# Patient Record
Sex: Female | Born: 1945
Health system: Southern US, Community
[De-identification: ages and names within clinical notes are randomized; demographics above are authoritative.]

## PROBLEM LIST (undated history)

## (undated) DIAGNOSIS — E785 Hyperlipidemia, unspecified: Secondary | ICD-10-CM

## (undated) DIAGNOSIS — K5790 Diverticulosis of intestine, part unspecified, without perforation or abscess without bleeding: Secondary | ICD-10-CM

## (undated) DIAGNOSIS — I10 Essential (primary) hypertension: Secondary | ICD-10-CM

## (undated) DIAGNOSIS — C801 Malignant (primary) neoplasm, unspecified: Secondary | ICD-10-CM

## (undated) DIAGNOSIS — D649 Anemia, unspecified: Secondary | ICD-10-CM

## (undated) DIAGNOSIS — M1990 Unspecified osteoarthritis, unspecified site: Secondary | ICD-10-CM

## (undated) DIAGNOSIS — N952 Postmenopausal atrophic vaginitis: Secondary | ICD-10-CM

## (undated) DIAGNOSIS — M316 Other giant cell arteritis: Secondary | ICD-10-CM

## (undated) HISTORY — DX: Hyperlipidemia, unspecified: E78.5

## (undated) HISTORY — DX: Essential (primary) hypertension: I10

## (undated) HISTORY — PX: COLON SURGERY: SHX602

## (undated) HISTORY — DX: Unspecified osteoarthritis, unspecified site: M19.90

## (undated) HISTORY — DX: Other giant cell arteritis: M31.6

## (undated) HISTORY — PX: OTHER SURGICAL HISTORY: SHX169

## (undated) HISTORY — DX: Diverticulosis of intestine, part unspecified, without perforation or abscess without bleeding: K57.90

## (undated) HISTORY — PX: TONSILLECTOMY: SHX5217

## (undated) HISTORY — DX: Postmenopausal atrophic vaginitis: N95.2

## (undated) HISTORY — DX: Anemia, unspecified: D64.9

## (undated) HISTORY — DX: Malignant (primary) neoplasm, unspecified: C80.1

---

## 1974-05-30 HISTORY — PX: TUBAL LIGATION: SHX77

## 1996-05-30 HISTORY — PX: OTHER SURGICAL HISTORY: SHX169

## 1997-09-16 ENCOUNTER — Other Ambulatory Visit: Admission: RE | Admit: 1997-09-16 | Discharge: 1997-09-16 | Payer: Self-pay | Admitting: Obstetrics and Gynecology

## 1998-12-03 ENCOUNTER — Other Ambulatory Visit: Admission: RE | Admit: 1998-12-03 | Discharge: 1998-12-03 | Payer: Self-pay | Admitting: Obstetrics and Gynecology

## 2000-02-18 ENCOUNTER — Other Ambulatory Visit: Admission: RE | Admit: 2000-02-18 | Discharge: 2000-02-18 | Payer: Self-pay | Admitting: Obstetrics and Gynecology

## 2000-11-14 ENCOUNTER — Other Ambulatory Visit: Admission: RE | Admit: 2000-11-14 | Discharge: 2000-11-14 | Payer: Self-pay | Admitting: Obstetrics and Gynecology

## 2000-11-14 ENCOUNTER — Encounter (INDEPENDENT_AMBULATORY_CARE_PROVIDER_SITE_OTHER): Payer: Self-pay | Admitting: Specialist

## 2004-02-06 ENCOUNTER — Ambulatory Visit (HOSPITAL_COMMUNITY): Admission: RE | Admit: 2004-02-06 | Discharge: 2004-02-06 | Payer: Self-pay | Admitting: Internal Medicine

## 2005-04-29 HISTORY — PX: OTHER SURGICAL HISTORY: SHX169

## 2005-06-09 ENCOUNTER — Ambulatory Visit: Payer: Self-pay | Admitting: Internal Medicine

## 2005-06-13 ENCOUNTER — Other Ambulatory Visit: Admission: RE | Admit: 2005-06-13 | Discharge: 2005-06-13 | Payer: Self-pay | Admitting: Internal Medicine

## 2005-06-13 ENCOUNTER — Encounter: Payer: Self-pay | Admitting: Internal Medicine

## 2005-06-13 ENCOUNTER — Ambulatory Visit: Payer: Self-pay | Admitting: Internal Medicine

## 2007-08-24 DIAGNOSIS — I10 Essential (primary) hypertension: Secondary | ICD-10-CM

## 2007-08-24 DIAGNOSIS — E785 Hyperlipidemia, unspecified: Secondary | ICD-10-CM

## 2007-08-24 HISTORY — DX: Essential (primary) hypertension: I10

## 2007-08-24 HISTORY — DX: Hyperlipidemia, unspecified: E78.5

## 2008-12-25 ENCOUNTER — Encounter: Payer: Self-pay | Admitting: Internal Medicine

## 2009-04-07 ENCOUNTER — Encounter: Payer: Self-pay | Admitting: Internal Medicine

## 2009-04-07 ENCOUNTER — Other Ambulatory Visit: Admission: RE | Admit: 2009-04-07 | Discharge: 2009-04-07 | Payer: Self-pay | Admitting: Internal Medicine

## 2009-04-07 ENCOUNTER — Ambulatory Visit: Payer: Self-pay | Admitting: Internal Medicine

## 2009-04-07 LAB — CONVERTED CEMR LAB
ALT: 15 units/L (ref 0–35)
Albumin: 4.3 g/dL (ref 3.5–5.2)
Basophils Absolute: 0.1 10*3/uL (ref 0.0–0.1)
Basophils Relative: 1 % (ref 0.0–3.0)
Bilirubin Urine: NEGATIVE
Bilirubin, Direct: 0.1 mg/dL (ref 0.0–0.3)
CO2: 29 meq/L (ref 19–32)
Calcium: 9.8 mg/dL (ref 8.4–10.5)
Creatinine, Ser: 0.7 mg/dL (ref 0.4–1.2)
Eosinophils Relative: 2.2 % (ref 0.0–5.0)
HCT: 36.1 % (ref 36.0–46.0)
Hemoglobin, Urine: NEGATIVE
Ketones, ur: NEGATIVE mg/dL
Lymphocytes Relative: 32.9 % (ref 12.0–46.0)
MCHC: 34.8 g/dL (ref 30.0–36.0)
MCV: 93.9 fL (ref 78.0–100.0)
Monocytes Absolute: 0.4 10*3/uL (ref 0.1–1.0)
Monocytes Relative: 7.4 % (ref 3.0–12.0)
Neutro Abs: 3.2 10*3/uL (ref 1.4–7.7)
Neutrophils Relative %: 56.5 % (ref 43.0–77.0)
Platelets: 330 10*3/uL (ref 150.0–400.0)
RBC: 3.84 M/uL — ABNORMAL LOW (ref 3.87–5.11)
RDW: 11.7 % (ref 11.5–14.6)
Total Protein, Urine: NEGATIVE mg/dL
Triglycerides: 101 mg/dL (ref 0.0–149.0)
WBC: 5.7 10*3/uL (ref 4.5–10.5)

## 2009-04-08 ENCOUNTER — Telehealth: Payer: Self-pay | Admitting: Internal Medicine

## 2009-04-10 ENCOUNTER — Encounter: Payer: Self-pay | Admitting: Internal Medicine

## 2009-07-28 ENCOUNTER — Encounter: Payer: Self-pay | Admitting: Internal Medicine

## 2010-07-01 NOTE — Letter (Signed)
Summary: Referral - not able to see patient  [facility]  [address] [place], [place] [address]   Phone: [phone]  Fax: [phone]    July 28, 2009   Illene Regulus, M.D. [address] [place], [place] [address]   Re:   Alexis Price DOB:  1946/01/09 MRN:   [id number]    Dear Dr. Debby Bud:  Thank you for your kind referral of the above patient.  We have attempted to schedule the recommended procedure Screening Colonoscopy but have not been able to schedule because:   X  The patient was not available by phone and/or has not returned our calls.  ___ The patient declined to schedule the procedure at this time.  We appreciate the referral and hope that we will have the opportunity to treat this patient in the future.    Sincerely,    [facility] [phone]

## 2010-10-16 ENCOUNTER — Other Ambulatory Visit: Payer: Self-pay | Admitting: Internal Medicine

## 2011-04-08 ENCOUNTER — Other Ambulatory Visit: Payer: Self-pay | Admitting: Internal Medicine

## 2011-05-11 ENCOUNTER — Ambulatory Visit (INDEPENDENT_AMBULATORY_CARE_PROVIDER_SITE_OTHER): Payer: Medicare Other | Admitting: Internal Medicine

## 2011-05-11 ENCOUNTER — Other Ambulatory Visit: Payer: Self-pay | Admitting: Internal Medicine

## 2011-05-11 ENCOUNTER — Other Ambulatory Visit (INDEPENDENT_AMBULATORY_CARE_PROVIDER_SITE_OTHER): Payer: Medicare Other

## 2011-05-11 VITALS — BP 154/80 | HR 66 | Temp 98.0°F | Ht 62.5 in | Wt 149.0 lb

## 2011-05-11 DIAGNOSIS — E785 Hyperlipidemia, unspecified: Secondary | ICD-10-CM

## 2011-05-11 DIAGNOSIS — Z0000 Encounter for general adult medical examination without abnormal findings: Secondary | ICD-10-CM

## 2011-05-11 DIAGNOSIS — I10 Essential (primary) hypertension: Secondary | ICD-10-CM

## 2011-05-11 DIAGNOSIS — N952 Postmenopausal atrophic vaginitis: Secondary | ICD-10-CM

## 2011-05-11 DIAGNOSIS — Z23 Encounter for immunization: Secondary | ICD-10-CM

## 2011-05-11 DIAGNOSIS — Z136 Encounter for screening for cardiovascular disorders: Secondary | ICD-10-CM

## 2011-05-11 LAB — HEPATIC FUNCTION PANEL
ALT: 16 U/L (ref 0–35)
AST: 22 U/L (ref 0–37)
Albumin: 4.2 g/dL (ref 3.5–5.2)
Alkaline Phosphatase: 53 U/L (ref 39–117)
Bilirubin, Direct: 0 mg/dL (ref 0.0–0.3)
Total Bilirubin: 0.5 mg/dL (ref 0.3–1.2)
Total Protein: 7.3 g/dL (ref 6.0–8.3)

## 2011-05-11 LAB — COMPREHENSIVE METABOLIC PANEL
ALT: 16 U/L (ref 0–35)
AST: 22 U/L (ref 0–37)
Albumin: 4.2 g/dL (ref 3.5–5.2)
Alkaline Phosphatase: 53 U/L (ref 39–117)
CO2: 30 mEq/L (ref 19–32)
Calcium: 9.7 mg/dL (ref 8.4–10.5)
Chloride: 103 mEq/L (ref 96–112)
Glucose, Bld: 88 mg/dL (ref 70–99)
Potassium: 4 mEq/L (ref 3.5–5.1)
Sodium: 139 mEq/L (ref 135–145)
Total Bilirubin: 0.5 mg/dL (ref 0.3–1.2)

## 2011-05-11 LAB — LIPID PANEL
Total CHOL/HDL Ratio: 3
Triglycerides: 90 mg/dL (ref 0.0–149.0)
VLDL: 18 mg/dL (ref 0.0–40.0)

## 2011-05-11 LAB — TSH: TSH: 1.87 u[IU]/mL (ref 0.35–5.50)

## 2011-05-11 MED ORDER — SULFAMETHOXAZOLE-TRIMETHOPRIM 800-160 MG PO TABS: 1.0000 | ORAL_TABLET | Freq: Two times a day (BID) | ORAL | Status: AC

## 2011-05-11 MED ORDER — SIMVASTATIN 20 MG PO TABS: 40.0000 mg | ORAL_TABLET | Freq: Every day | ORAL | Status: DC

## 2011-05-11 MED ORDER — LISINOPRIL-HYDROCHLOROTHIAZIDE 20-12.5 MG PO TABS: 1.0000 | ORAL_TABLET | Freq: Every day | ORAL | Status: DC

## 2011-05-11 NOTE — Progress Notes (Signed)
Subjective:    Patient ID: Alexis Price, female    DOB: 07-15-1945, 65 y.o.   MRN: [id number]  HPI   The patient is here for annual Medicare wellness examination and management of other chronic and acute problems.  In the interval she has had no major medical illness, no injury and no surgery. She has developed paresthesia of the right thumb; she has had a cold with a bitter, metallic taste in the mouth. She does report dysparunia - chart review indicates she had atrophic introitus and atrophic vaginitis at her exam in '10   The risk factors are reflected in the social history.  The roster of all physicians providing medical care to patient - is listed in the Snapshot section of the chart.  Activities of daily living:  The patient is 100% inedpendent in all ADLs: dressing, toileting, feeding as well as independent mobility  Home safety : The patient has smoke detectors in the home. They wear seatbelts. No firearms at home. There is no violence in the home.   There is no risks for hepatitis, STDs or HIV. There is no   history of blood transfusion. They have no travel history to infectious disease endemic areas of the world.  The patient has not seen their dentist in the last six month. They have seen their eye doctor in the last year. They deny  any hearing difficulty and have not had audiologic testing in the last year.  They do not  have excessive sun exposure. Discussed the need for sun protection: hats, long sleeves and use of sunscreen if there is significant sun exposure.   Diet: the importance of a healthy diet is discussed. They do have a healthy diet.  The patient has a regular exercise program: cardio , 40 minutes duration, 3 to 4 times per week.  The benefits of regular aerobic exercise were discussed.  Depression screen: there are no signs or vegative symptoms of depression- irritability, change in appetite, anhedonia, sadness/tearfullness.  Cognitive assessment: the  patient manages all their financial and personal affairs and is actively engaged.   The following portions of the patient's history were reviewed and updated as appropriate: allergies, current medications, past family history, past medical history,  past surgical history, past social history  and problem list.  Vision, hearing, body mass index were assessed and reviewed.   During the course of the visit the patient was educated and counseled about appropriate screening and preventive services including : fall prevention , diabetes screening, nutrition counseling, colorectal cancer screening, and recommended immunizations.   Past History:  Past Medical History: Last updated: 08/24/2007 Hypertension Hyperlipidemia  Past Surgical History: Last updated: 08/24/2007 Tonsillectomy - age 63 Tubal ligation -1976 Emergent colectomy with creation of colosomy Takedown of colostomy with salpingectomy and lysis of adhesion- 04/2005 Flex sigmoidoscopy- 1998  Family History: Father- 75 years, died of MI Mother- mother is 25, HTN,  valve replacements, CAD  Social History: Married '67 2 sons - '69, '76 Retired from 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  Yes,  feels safe in home Minimal exercise 3-4 times per week-achohol use No smoking, no recreational drug use Sexually active, one partner   Review of Systems Constitutional:  Negative for fever, chills, activity change and unexpected weight change.  HEENT:  Negative for hearing loss, ear pain, congestion, neck stiffness and postnasal drip. Negative for sore throat or swallowing problems. Negative for dental complaints.   Eyes: Negative for vision loss or change in  visual acuity.  Respiratory: Negative for chest tightness and wheezing. Negative for DOE.   Cardiovascular: Negative for chest pain or palpitations. No decreased exercise tolerance Gastrointestinal: No change in bowel habit. No bloating or gas. No reflux or  indigestion Genitourinary: Negative for urgency, frequency, flank pain and difficulty urinating. Dysparunia. Stress incontinence Musculoskeletal: Negative for myalgias, back pain, arthralgias and gait problem. Chronic hand arthritis Neurological: Negative for dizziness, tremors, weakness and headaches.  Hematological: Negative for adenopathy.  Psychiatric/Behavioral: Negative for behavioral problems and dysphoric mood.         Objective:   Physical Exam Vitals - mild increase in BP noted 154/80 Gen'l- WNWD white woman in no distress HEENT - C&S clear, pupils equal and round, fundi - normal w/ handheld instrument; EACs/TMs normal; oropharynx w/o lesions, dentition in good repair. Neck - supple, no thyromegaly Nodes - negative submandibular, cervical and axillary areas Pulm - normal respirations Breast exam - skin normal, nipples w/o discharge, no fixed mass or lesions noted. Cor - 2+ radial and DP pulses, RRR w/o murmurs Abd - BS+, soft, no hepato-splenomegaly, no guarding or rebound Pelvic/rectal - deferred to normal exam, except for atrophic vaginitis, 2010 Ext - no deformity, normal Range of motion all medium and large joints Neuro - CN II-XII intact, normal strength, normal DTRs, normal gail Skin - no suspicious lesions face, neck, upper back, chest.  Labs ordered and pending        Assessment & Plan:

## 2011-05-11 NOTE — Progress Notes (Deleted)
  Subjective:    Patient ID: Alexis Price, female    DOB: [birth year], 65 y.o.   MRN: [id number]  HPI Alexis Price was last seen in Nov '10. In the interval she has developed paresthesia of the right thumb about a month ago. She has developed a cold over the past week and she has a metallic taste in her month. Since her    Review of Systems     Objective:   Physical Exam        Assessment & Plan:

## 2011-05-11 NOTE — Patient Instructions (Signed)
Numb thumb - minor deficit. No apparent cause. Will check routine labs.  Cholesterol - it was too high at last lab - will check today, but will go ahead an dincrease simvastatin to 40 mg once a day.  Atrophic vaginitis - cause of dysparunia. Plan premarin vaginal cream, 1 g (  applicator) daily for 14 days then two times a week.   Atrophic Vaginitis Atrophic vaginitis is a problem of low levels of estrogen in women. This problem can happen at any age. It is most common in women who have gone through menopause ("the change").   HOW WILL I KNOW IF I HAVE THIS PROBLEM? You may have:  Trouble with peeing (urinating), such as:     Going to the bathroom often.     A hard time holding your pee until you reach a bathroom.     Leaking pee.     Having pain when you pee.     Itching or a burning feeling.     Vaginal bleeding and spotting.     Pain during sex.     Dryness of the vagina.     A yellow, bad-smelling fluid (discharge) coming from the vagina.  HOW WILL MY DOCTOR CHECK FOR THIS PROBLEM?  During your exam, your doctor will likely find the problem.     If there is a vaginal fluid, it may be checked for infection.  HOW WILL THIS PROBLEM BE TREATED? Keep the vulvar skin as clean as possible. Moisturizers and lubricants can help with some of the symptoms. Estrogen replacement can help. There are 2 ways to take estrogen:  Systemic estrogen gets estrogen to your whole body. It takes many weeks or months before the symptoms get better.     You take an estrogen pill.     You use a skin patch. This is a patch that you put on your skin.     If you still have your uterus, your doctor may ask you to take a hormone. Talk to your doctor about the right medicine for you.     Estrogen cream.  This puts estrogen only at the part of your body where you apply it. The cream is put into the vagina or put on the vulvar skin. For some women, estrogen cream works faster than pills or the patch.   CAN ALL WOMEN WITH THIS PROBLEM USE ESTROGEN? No. Women with certain types of cancer, liver problems, or problems with blood clots should not take estrogen. Your doctor can help you decide the best treatment for your symptoms. Document Released: 11/02/2007 Document Revised: 01/27/2011 Document Reviewed: 11/02/2007 [facility] Patient Information 2012 [place], [place].

## 2011-05-12 DIAGNOSIS — N952 Postmenopausal atrophic vaginitis: Secondary | ICD-10-CM

## 2011-05-12 DIAGNOSIS — Z0000 Encounter for general adult medical examination without abnormal findings: Secondary | ICD-10-CM | POA: Insufficient documentation

## 2011-05-12 HISTORY — DX: Postmenopausal atrophic vaginitis: N95.2

## 2011-05-12 LAB — LDL CHOLESTEROL, DIRECT: Direct LDL: 130.1 mg/dL

## 2011-05-12 NOTE — Assessment & Plan Note (Signed)
BP Readings from Last 3 Encounters:  05/11/11 154/80  04/07/09 162/98   suboptimal control.  Plan - renewed medications           Home monitoring with report back. If Systolics are consistently  140+ will need to adjust medications.

## 2011-05-12 NOTE — Assessment & Plan Note (Signed)
Patient with dysparunia. She had atrophic introitus and vaginitis at pelvic exam in '10. Explanation and handout provided.  Plan - premarin vaginal cream

## 2011-05-12 NOTE — Assessment & Plan Note (Signed)
Interval medical history notable for dysparunia but no major medical problems, surgery injury. Physical exam, sans pelvic exam, is normal. Labs are pending. Breast cancer screening - last mammogram '10. She will schedule her mammogram at [facility]. She is due for colorectal cancer screening and will contact the office when she is ready for colonoscopy to be scheduled. She is current with gyn screening. She will need vulvo-vaginal exam at next office visit. Immunizations: she is due for Tdap, pneumonia and shingles vaccines. 12 lead EKG without signs of injury or ischemia.  In summary - a nice woman who seems generally stable. Labs are pending with recommendations to follow. She will be offered immunizations at her next visit. She will need to check to see if her medicare plan covers shingles vaccine. She will monitor her blood pressure and return for OV in 1-2 months with her readings.

## 2011-05-12 NOTE — Assessment & Plan Note (Signed)
Last lab 78295 with LDL 181. She has continued on simvastatin of 181 with no change since 2010.  Plan - lipid panel ordered           Renewed simvastatin at higher dose of 40 mg daily

## 2011-05-15 ENCOUNTER — Encounter: Payer: Self-pay | Admitting: Internal Medicine

## 2011-05-20 ENCOUNTER — Encounter: Payer: Self-pay | Admitting: Internal Medicine

## 2012-01-02 ENCOUNTER — Ambulatory Visit (INDEPENDENT_AMBULATORY_CARE_PROVIDER_SITE_OTHER): Payer: Medicare Other | Admitting: Internal Medicine

## 2012-01-02 ENCOUNTER — Encounter: Payer: Self-pay | Admitting: Internal Medicine

## 2012-01-02 VITALS — BP 132/82 | HR 64 | Temp 98.3°F | Ht 62.5 in | Wt 148.8 lb

## 2012-01-02 DIAGNOSIS — G5601 Carpal tunnel syndrome, right upper limb: Secondary | ICD-10-CM

## 2012-01-02 DIAGNOSIS — R200 Anesthesia of skin: Secondary | ICD-10-CM

## 2012-01-02 DIAGNOSIS — M79609 Pain in unspecified limb: Secondary | ICD-10-CM

## 2012-01-02 DIAGNOSIS — G5600 Carpal tunnel syndrome, unspecified upper limb: Secondary | ICD-10-CM

## 2012-01-02 DIAGNOSIS — M79601 Pain in right arm: Secondary | ICD-10-CM

## 2012-01-02 DIAGNOSIS — R209 Unspecified disturbances of skin sensation: Secondary | ICD-10-CM

## 2012-01-02 MED ORDER — GABAPENTIN 300 MG PO CAPS: 300.0000 mg | ORAL_CAPSULE | Freq: Every day | ORAL | Status: DC

## 2012-01-02 NOTE — Patient Instructions (Addendum)
It was good to see you today. we'll make referral for nerve conduction study . Our office will contact you regarding appointment(s) once made. Use wrist splint at night, watch positioning in bed as discussed (ok to raise hand over head as needed) and use gabapentin to help with nerve pain symptoms  Further treatment plan will depend on your symptoms and test results - call sooner if worse Carpal Tunnel Syndrome You may have carpal tunnel syndrome. This is a common condition. Carpal tunnel syndrome occurs when the tendons, bones, or ligaments in the wrist press against the median nerve as it passes into the hand.   Symptoms can include:  Intermittent numbness.     Pain or a tingling sensation in thumb and first two fingers.  The pain may radiate up to the shoulder. There may even be weakness in the hand muscles. The pain is often worse at night and in the early morning. Nerve conduction tests may be used to prove the diagnosis. Carpal tunnel syndrome is most often due to repeated movements of the hand or wrist. Other causes can include:  Prior injuries.     Diabetes.    Obesity.    Smoking.    Pregnancy. Symptoms that develop during pregnancy often stop when the pregnancy is over.  Treatment includes:  Splinting - A wrist splint helps prevent movements that irritate the nerve. Splints are especially helpful at night when the symptoms are often worse.     Ice packs - Cold packs applied to the palm side of the wrist for 20 minutes every 2 hours while awake may give some relief.     Medication - Medicine to reduce inflammation and pain are often used. Cortisone injections around the nerve may also bring improvement.  Severe cases of carpal tunnel syndrome can require surgery to relieve the pressure on the nerve. This may be necessary if there is evidence of weakness or decreased sensation in your hand, or if your symptoms do not improve with conservative treatment. See your caregiver for  follow-up to be certain your condition is improving. Document Released: 06/23/2004 Document Revised: 01/26/2011 Document Reviewed: 03/22/2007 [facility] Patient Information 2012 [place], [place].

## 2012-01-02 NOTE — Progress Notes (Signed)
  Subjective:    Patient ID: Alexis Price, female    DOB: 06-28-1945, 66 y.o.   MRN: [id number]  HPI  complains of RUE "numbness and tingling" - proximal>distal Onset 3 weeks ago Describes "burning and stinging" over inner biceps and elbow/prox forearm- symptoms worse at night -  Pain radiates into hand and causes "asleep" feeling, again worst at night symptoms improved by holding hand over head and change position in bed No change in bed/pillow - no falls or injury - no weakness  Past Medical History  Diagnosis Date  . HYPERLIPIDEMIA   . HYPERTENSION   . Postmenopausal atrophic vaginitis 05/12/2011      Review of Systems  Constitutional: Negative for fever, fatigue and unexpected weight change.  HENT: Negative for neck pain and neck stiffness.   Musculoskeletal: Positive for arthralgias. Negative for back pain and gait problem.  Neurological: Negative for weakness and headaches.       Objective:   Physical Exam BP 132/82  Pulse 64  Temp 98.3 F (36.8 C) (Oral)  Ht 5' 2.5" (1.588 m)  Wt 148 lb 12.8 oz (67.495 kg)  BMI 26.78 kg/m2  SpO2 98% Wt Readings from Last 3 Encounters:  01/02/12 148 lb 12.8 oz (67.495 kg)  05/11/11 149 lb (67.586 kg)  04/07/09 146 lb (66.225 kg)   Constitutional: She appears well-developed and well-nourished. No distress.  HENT: Head: Normocephalic and atraumatic. Ears: B TMs ok, no erythema or effusion; Nose: Nose normal. Mouth/Throat: Oropharynx is clear and moist. No oropharyngeal exudate.  Eyes: Conjunctivae and EOM are normal. Pupils are equal, round, and reactive to light. No scleral icterus.  Neck: Normal range of motion. Neck supple. No JVD present. No thyromegaly present.  Cardiovascular: Normal rate, regular rhythm and normal heart sounds.  No murmur heard. No BLE edema. Pulmonary/Chest: Effort normal and breath sounds normal. No respiratory distress. She has no wheezes.  Musculoskeletal: Normal range of motion, no joint  effusions. No gross deformities Neurological: She is alert and oriented to person, place, and time. No cranial nerve deficit. Coordination normal. Good grip strength B hands Skin: Skin is warm and dry. No rash noted. No erythema.  Psychiatric: She has a normal mood and affect. Her behavior is normal. Judgment and thought content normal.   Lab Results  Component Value Date   WBC 5.7 04/07/2009   HGB 12.6 04/07/2009   HCT 36.1 04/07/2009   PLT 330.0 04/07/2009   GLUCOSE 88 05/11/2011   CHOL 202* 05/11/2011   TRIG 90.0 05/11/2011   HDL 59.70 05/11/2011   LDLDIRECT 130.1 05/11/2011   ALT 16 05/11/2011   ALT 16 05/11/2011   AST 22 05/11/2011   AST 22 05/11/2011   NA 139 05/11/2011   K 4.0 05/11/2011   CL 103 05/11/2011   CREATININE 0.6 05/11/2011   BUN 14 05/11/2011   CO2 30 05/11/2011   TSH 1.87 05/11/2011   No results found for this basename: VITAMINB12       Assessment & Plan:  RUE neuropathy - numbness and pain x 3 weeks  suspect cervical radicular pain due to DDD vs CTS  Order PNCS as some overlap with CTS - Start gabapentin and recommended wrist splint qhs - provided in office today Further tx to depend on symptoms and NCS results

## 2012-03-26 DIAGNOSIS — Z1231 Encounter for screening mammogram for malignant neoplasm of breast: Secondary | ICD-10-CM | POA: Diagnosis not present

## 2012-03-26 DIAGNOSIS — Z803 Family history of malignant neoplasm of breast: Secondary | ICD-10-CM | POA: Diagnosis not present

## 2012-04-06 ENCOUNTER — Encounter: Payer: Self-pay | Admitting: Internal Medicine

## 2012-06-26 ENCOUNTER — Other Ambulatory Visit (INDEPENDENT_AMBULATORY_CARE_PROVIDER_SITE_OTHER): Payer: Medicare Other

## 2012-06-26 ENCOUNTER — Ambulatory Visit (INDEPENDENT_AMBULATORY_CARE_PROVIDER_SITE_OTHER): Payer: Medicare Other | Admitting: Internal Medicine

## 2012-06-26 ENCOUNTER — Encounter: Payer: Self-pay | Admitting: Internal Medicine

## 2012-06-26 VITALS — BP 130/80 | HR 63 | Temp 97.6°F | Ht 62.0 in | Wt 152.2 lb

## 2012-06-26 DIAGNOSIS — I10 Essential (primary) hypertension: Secondary | ICD-10-CM

## 2012-06-26 DIAGNOSIS — Z1211 Encounter for screening for malignant neoplasm of colon: Secondary | ICD-10-CM

## 2012-06-26 DIAGNOSIS — Z0000 Encounter for general adult medical examination without abnormal findings: Secondary | ICD-10-CM

## 2012-06-26 DIAGNOSIS — E785 Hyperlipidemia, unspecified: Secondary | ICD-10-CM | POA: Diagnosis not present

## 2012-06-26 DIAGNOSIS — N952 Postmenopausal atrophic vaginitis: Secondary | ICD-10-CM

## 2012-06-26 DIAGNOSIS — Z23 Encounter for immunization: Secondary | ICD-10-CM | POA: Diagnosis not present

## 2012-06-26 LAB — LIPID PANEL
Cholesterol: 214 mg/dL — ABNORMAL HIGH (ref 0–200)
HDL: 79.4 mg/dL (ref >39.00)
Total CHOL/HDL Ratio: 3
VLDL: 11 mg/dL (ref 0.0–40.0)

## 2012-06-26 LAB — HEPATIC FUNCTION PANEL
Albumin: 4.4 g/dL (ref 3.5–5.2)
Total Bilirubin: 0.7 mg/dL (ref 0.3–1.2)
Total Protein: 7.7 g/dL (ref 6.0–8.3)

## 2012-06-26 LAB — COMPREHENSIVE METABOLIC PANEL
BUN: 13 mg/dL (ref 6–23)
CO2: 30 mEq/L (ref 19–32)
Calcium: 10.3 mg/dL (ref 8.4–10.5)
Chloride: 103 mEq/L (ref 96–112)
Potassium: 4.9 mEq/L (ref 3.5–5.1)
Total Bilirubin: 0.7 mg/dL (ref 0.3–1.2)

## 2012-06-26 LAB — CBC WITH DIFFERENTIAL/PLATELET
Lymphs Abs: 1.9 10*3/uL (ref 0.7–4.0)
MCHC: 33.9 g/dL (ref 30.0–36.0)
Monocytes Absolute: 0.6 10*3/uL (ref 0.1–1.0)
Neutrophils Relative %: 57.9 % (ref 43.0–77.0)
Platelets: 308 10*3/uL (ref 150.0–400.0)
RBC: 4.24 Mil/uL (ref 3.87–5.11)
RDW: 13 % (ref 11.5–14.6)

## 2012-06-26 MED ORDER — LISINOPRIL-HYDROCHLOROTHIAZIDE 20-12.5 MG PO TABS: 1.0000 | ORAL_TABLET | Freq: Every day | ORAL | Status: DC

## 2012-06-26 MED ORDER — ESTROGENS, CONJUGATED 0.625 MG/GM VA CREA: TOPICAL_CREAM | VAGINAL | Status: DC

## 2012-06-26 NOTE — Patient Instructions (Addendum)
Thanks for coming in. I am sorry for your loss.  YOu are doing great - normal exam today.  For lab today with a report to follow and you may access results through MyChart.  I will see you in a year or sooner as needed.

## 2012-06-26 NOTE — Progress Notes (Signed)
Subjective:    Patient ID: Alexis Price, female    DOB: 1945/11/24, 67 y.o.   MRN: [id number]  HPI The patient is here for annual Medicare wellness examination and management of other chronic and acute problems.  Interval history: had an itch/paresthesia which resolved with a short course of gabapentin and wrist splint.    The risk factors are reflected in the social history.  The roster of all physicians providing medical care to patient - is listed in the Snapshot section of the chart.  Activities of daily living:  The patient is 100% inedpendent in all ADLs: dressing, toileting, feeding as well as independent mobility  Home safety : The patient has smoke detectors in the home. Falls - no. Home is fall safe except grab bars.They wear seatbelts. No firearms at home   There is no risks for hepatitis, STDs or HIV. There is no   history of blood transfusion. They have no travel history to infectious disease endemic areas of the world.  The patient has  seen their dentist in the last 12 month. They have  seen their eye doctor in the last year. They deny any hearing difficulty and have not had audiologic testing in the last year.    They do not  have excessive sun exposure. Discussed the need for sun protection: hats, long sleeves and use of sunscreen if there is significant sun exposure.   Diet: the importance of a healthy diet is discussed. They do have a healthy diet.  The patient has a regular exercise program: walk/stationary bike , 45 min duration, 5 per week.  The benefits of regular aerobic exercise were discussed.  Depression screen: there are no signs or vegative symptoms of depression- irritability, change in appetite, anhedonia, sadness/tearfullness.  Cognitive assessment: the patient manages all their financial and personal affairs and is actively engaged.   The following portions of the patient's history were reviewed and updated as appropriate: allergies, current  medications, past family history, past medical history,  past surgical history, past social history  and problem list.  Past Medical History  Diagnosis Date  . HYPERLIPIDEMIA   . HYPERTENSION   . Postmenopausal atrophic vaginitis 05/12/2011   Past Surgical History  Procedure Date  . Tonsillectomy     age 26  . Tubal ligation 1976  . Emergent colectomy with creation of colosomy   . [place] of colostomy with salpingectomy and lysis of adhesion 04/2005  . Flex sigmoidoscopy 1998   Family History  Problem Relation Age of Onset  . Hypertension Mother   . Heart disease Mother   . Coronary artery disease Mother     valve repalcements   History   Social History  . Marital Status: Single    Spouse Name: N/A    Number of Children: N/A  . Years of Education: N/A   Occupational History  . Not on file.   Social History Main Topics  . Smoking status: Never Smoker   . Smokeless tobacco: Not on file  . Alcohol Use: Yes     Comment: 3-4 times per week  . Drug Use: No  . Sexually Active:    Other Topics Concern  . Not on file   Social History Narrative  . No narrative on file      Vision, hearing, body mass index were assessed and reviewed.   During the course of the visit the patient was educated and counseled about appropriate screening and preventive services including : fall prevention ,  diabetes screening, nutrition counseling, colorectal cancer screening, and recommended immunizations.    Review of Systems Constitutional:  Negative for fever, chills, activity change and unexpected weight change.  HEENT:  Negative for hearing loss, ear pain, congestion, neck stiffness and postnasal drip. Negative for sore throat or swallowing problems. Negative for dental complaints.   Eyes: Negative for vision loss or change in visual acuity.  Respiratory: Negative for chest tightness and wheezing. Negative for DOE.   Cardiovascular: Negative for chest pain or palpitations. No  decreased exercise tolerance Gastrointestinal: No change in bowel habit. No bloating or gas. No reflux or indigestion Genitourinary: Negative for urgency, frequency, flank pain and difficulty urinating.  Musculoskeletal: Negative for myalgias, back pain, arthralgias and gait problem.  Neurological: Negative for dizziness, tremors, weakness and headaches.  Hematological: Negative for adenopathy.  Psychiatric/Behavioral: Negative for behavioral problems and dysphoric mood.       Objective:   Physical Exam Filed Vitals:   06/26/12 1340  BP: 130/80  Pulse: 63  Temp: 97.6 F (36.4 C)   Wt Readings from Last 3 Encounters:  06/26/12 152 lb 3.2 oz (69.037 kg)  01/02/12 148 lb 12.8 oz (67.495 kg)  05/11/11 149 lb (67.586 kg)   Gen'l: well nourished, well developed white Woman in no distress HEENT - [place]/AT, EACs/TMs normal, oropharynx with native dentition in good condition, no buccal or palatal lesions, posterior pharynx clear, mucous membranes moist. C&S clear, PERRLA, fundi - normal Neck - supple, no thyromegaly Nodes- negative submental, cervical, supraclavicular regions Chest - no deformity, no CVAT Lungs - cleat without rales, wheezes. No increased work of breathing Breast - deferred to normal mammography Cardiovascular - regular rate and rhythm, quiet precordium, no murmurs, rubs or gallops, 2+ radial, DP and PT pulses Abdomen - BS+ x 4, no HSM, no guarding or rebound or tenderness Pelvic - deferred to age Rectal - deferred to GI Extremities - no clubbing, cyanosis, edema or deformity.  Neuro - A&O x 3, CN II-XII normal, motor strength normal and equal, DTRs 2+ and symmetrical biceps, radial, and patellar tendons. Cerebellar - no tremor, no rigidity, fluid movement and normal gait. Derm - Head, neck, back, abdomen and extremities without suspicious lesions  Lab Results  Component Value Date   WBC 6.4 06/26/2012   HGB 12.8 06/26/2012   HCT 37.8 06/26/2012   PLT 308.0 06/26/2012    GLUCOSE 95 06/26/2012   CHOL 214* 06/26/2012   TRIG 55.0 06/26/2012   HDL 79.40 06/26/2012   LDLDIRECT 113.3 06/26/2012        ALT 14 06/26/2012   AST 20 06/26/2012        NA 141 06/26/2012   K 4.9 06/26/2012   CL 103 06/26/2012   CREATININE 0.7 06/26/2012   BUN 13 06/26/2012   CO2 30 06/26/2012   TSH 1.87 05/11/2011         Assessment & Plan:

## 2012-06-27 ENCOUNTER — Encounter: Payer: Self-pay | Admitting: Internal Medicine

## 2012-06-27 NOTE — Assessment & Plan Note (Signed)
BP Readings from Last 3 Encounters:  06/26/12 130/80  01/02/12 132/82  05/11/11 154/80   Good control. Electrolytes and renal function normal  Plan  Continue present treatment

## 2012-06-27 NOTE — Assessment & Plan Note (Signed)
Interval h/o negative for any major issues. Limited exam is normal. Lab results reviewed and in normal limits. No record of colonoscopy in EPIC. She is current for gyn screening. Immunizations - current with tetanus and flu, will need to return for pneumonia vaccine and at a later day for shigles vaccine.  In summary - a very pleasant and healthy woman. She is advised to have pneumonia and shingles vaccine. Her general health is good. She will return in 1 year or sooner as needed.

## 2012-06-27 NOTE — Assessment & Plan Note (Signed)
LDL is better than goal of 130 or less, HDL is robust and protective giving a lower than average risk for any cardiac event  Plan- continue present medication

## 2012-06-27 NOTE — Assessment & Plan Note (Signed)
Continued problem with discomfort and dyspareunia.  Plan Premarin vaginal cream 1/g qd x 10 days then 2/wk   Allow 6-8 weeks to see real change.

## 2012-06-28 ENCOUNTER — Other Ambulatory Visit: Payer: Self-pay | Admitting: Internal Medicine

## 2012-06-29 ENCOUNTER — Other Ambulatory Visit: Payer: Self-pay | Admitting: *Deleted

## 2013-01-02 ENCOUNTER — Other Ambulatory Visit: Payer: Self-pay

## 2013-01-14 DIAGNOSIS — H2510 Age-related nuclear cataract, unspecified eye: Secondary | ICD-10-CM | POA: Diagnosis not present

## 2013-01-22 ENCOUNTER — Other Ambulatory Visit: Payer: Self-pay | Admitting: Internal Medicine

## 2013-03-06 ENCOUNTER — Encounter: Payer: Self-pay | Admitting: Internal Medicine

## 2013-03-06 ENCOUNTER — Ambulatory Visit (INDEPENDENT_AMBULATORY_CARE_PROVIDER_SITE_OTHER): Payer: Medicare Other | Admitting: Internal Medicine

## 2013-03-06 ENCOUNTER — Ambulatory Visit (INDEPENDENT_AMBULATORY_CARE_PROVIDER_SITE_OTHER)
Admission: RE | Admit: 2013-03-06 | Discharge: 2013-03-06 | Disposition: A | Discharge status: Home / Self Care | Payer: Medicare Other | Source: Ambulatory Visit | Attending: Internal Medicine | Admitting: Internal Medicine

## 2013-03-06 VITALS — BP 130/74 | HR 62 | Temp 97.9°F | Wt 154.8 lb

## 2013-03-06 DIAGNOSIS — M436 Torticollis: Secondary | ICD-10-CM

## 2013-03-06 DIAGNOSIS — Z23 Encounter for immunization: Secondary | ICD-10-CM | POA: Diagnosis not present

## 2013-03-06 DIAGNOSIS — M4310 Spondylolisthesis, site unspecified: Secondary | ICD-10-CM | POA: Diagnosis not present

## 2013-03-06 MED ORDER — CYCLOBENZAPRINE HCL 5 MG PO TABS: 5.0000 mg | ORAL_TABLET | Freq: Three times a day (TID) | ORAL | Status: DC | PRN

## 2013-03-06 NOTE — Progress Notes (Signed)
Subjective:    Patient ID: Alexis Price, female    DOB: 02/18/1946, 67 y.o.   MRN: [id number]  HPI Alexis Price presents with a 3 week h/o severe neck pain, worse with rotation of head and she reports her head feels very enlarged. She did have an infected tooth implant that came out and she was treated by her dentist with antibiotics. She does not recall any injury or overuse. Her head pain is more occipital and vertex in location. Severity pain will wax up to 8/10. She also has trouble sleeping due to neck pain. She does feel like she has had muscle spasm. She does take aleve qAM and an occasional ibuprofen or aleve at night. No visual change.  No focal weakness, no trouble with coordination. She has had one episode of paresthesia left arm lasting 15 minutes with spontaneous resolution.  Past Medical History  Diagnosis Date  . HYPERLIPIDEMIA   . HYPERTENSION   . Postmenopausal atrophic vaginitis 05/12/2011   Past Surgical History  Procedure Laterality Date  . Tonsillectomy      age 43  . Tubal ligation  1976  . Emergent colectomy with creation of colosomy    . [place] of colostomy with salpingectomy and lysis of adhesion  04/2005  . Flex sigmoidoscopy  1998   Family History  Problem Relation Age of Onset  . Hypertension Mother   . Heart disease Mother   . Coronary artery disease Mother     valve repalcements   History   Social History  . Marital Status: Married    Spouse Name: N/A    Number of Children: 2  . Years of Education: 16   Occupational History  . office manager    Social History Main Topics  . Smoking status: Never Smoker   . Smokeless tobacco: Never Used  . Alcohol Use: Yes     Comment: 3-4 times per week  . Drug Use: No  . Sexual Activity: Yes    Partners: Male   Other Topics Concern  . Not on file   Social History Narrative   [employer], [employer] - night school. Married '67. 2 sons ' '69, '77. 4 grand-daughters. 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     Current Outpatient Prescriptions on File Prior to Visit  Medication Sig Dispense Refill  . conjugated estrogens (PREMARIN) vaginal cream Place vaginally as directed. 1 g Daily x 10 days then twice a week after that  42.5 g  12  . lisinopril-hydrochlorothiazide (PRINZIDE,ZESTORETIC) 20-12.5 MG per tablet TAKE ONE TABLET BY MOUTH ONE TIME DAILY  30 tablet  0  . simvastatin (ZOCOR) 20 MG tablet TAKE TWO TABLETS BY MOUTH DAILY AT BEDTIME  30 tablet  11   No current facility-administered medications on file prior to visit.      Review of Systems System review is negative for any constitutional, cardiac, pulmonary, GI or neuro symptoms or complaints other than as described in the HPI.     Objective:   Physical Exam Filed Vitals:   03/06/13 1555  BP: 130/74  Pulse: 62  Temp: 97.9 F (36.6 C)   Gen'l- WNWD white woman in no distress HEENT - C&S clear, no palpable or tender temporal arteries Neck - decreased ROM due to pain with rotation, right more than left; flexion 60% of normal; extension 80% normal. No mass palpated. Tender to palpation posterior cervical and occipital area. Cor- RRR Pulm - normal respiration Neuro - Awake and alert, CN II-XII - normal  facial symmetry and movement, PERRLA, EOMI, no deviation or fasciculation of tongue, normal shoulder shrug.        Assessment & Plan:  Neck pain - on exam there is no evidence of a central nervous system injury or problem: no evidence of stroke, tumor or vascular problem. It appears to be a problem of muscles and tendons and may be related to cervical disease, e.g. Degenerative joint disease or degenerative disc disease.  Plan X-rays of the next - results will be posted to MyChart  Increase aleve to 2 tablets twice a day - watch for gastric irritation  Muscle relaxant - flexeril 5 mg every 6 hours.  If x-rays are normal and if the discomfort persists without improvement next steps will be:   1. CT scan of the Brain               2. May need a sports medicine or neurology consult.

## 2013-03-06 NOTE — Patient Instructions (Signed)
Neck pain - on exam there is no evidence of a central nervous system injury or problem: no evidence of stroke, tumor or vascular problem. It appears to be a problem of muscles and tendons and may be related to cervical disease, e.g. Degenerative joint disease or degenerative disc disease.  Plan X-rays of the next - results will be posted to MyChart  Increase aleve to 2 tablets twice a day - watch for gastric irritation  Muscle relaxant - flexeril 5 mg every 6 hours.  If x-rays are normal and if the discomfort persists without improvement next steps will be:   1. CT scan of the Brain   2. May need a sports medicine or neurology consult.Torticollis, Acute You have suddenly (acutely) developed a twisted neck (torticollis). This is usually a self-limited condition. CAUSES  Acute torticollis may be caused by malposition, trauma or infection. Most commonly, acute torticollis is caused by sleeping in an awkward position. Torticollis may also be caused by the flexion, extension or twisting of the neck muscles beyond their normal position. Sometimes, the exact cause may not be known. SYMPTOMS  Usually, there is pain and limited movement of the neck. Your neck may twist to one side. DIAGNOSIS  The diagnosis is often made by physical examination. X-rays, CT scans or MRIs may be done if there is a history of trauma or concern of infection. TREATMENT  For a common, stiff neck that develops during sleep, treatment is focused on relaxing the contracted neck muscle. Medications (including shots) may be used to treat the problem. Most cases resolve in several days. Torticollis usually responds to conservative physical therapy. If left untreated, the shortened and spastic neck muscle can cause deformities in the face and neck. Rarely, surgery is required. HOME CARE INSTRUCTIONS   Use over-the-counter and prescription medications as directed by your caregiver.  Do stretching exercises and massage the neck as  directed by your caregiver.  Follow up with physical therapy if needed and as directed by your caregiver. SEEK IMMEDIATE MEDICAL CARE IF:   You develop difficulty breathing or noisy breathing (stridor).  You drool, develop trouble swallowing or have pain with swallowing.  You develop numbness or weakness in the hands or feet.  You have changes in speech or vision.  You have problems with urination or bowel movements.  You have difficulty walking.  You have a fever.  You have increased pain. MAKE SURE YOU:   Understand these instructions.  Will watch your condition.  Will get help right away if you are not doing well or get worse. Document Released: 05/13/2000 Document Revised: 08/08/2011 Document Reviewed: 06/24/2009 [facility] Patient Information 2014 [place], [place].

## 2013-03-08 ENCOUNTER — Encounter: Payer: Self-pay | Admitting: Internal Medicine

## 2013-04-24 ENCOUNTER — Telehealth: Payer: Self-pay

## 2013-04-24 NOTE — Telephone Encounter (Signed)
Phone call to patient at her work # [phone] letting her know she is due for her Prevnar vaccine. She states she will call back to schedule this.

## 2013-05-07 ENCOUNTER — Other Ambulatory Visit: Payer: Self-pay

## 2013-05-07 MED ORDER — SIMVASTATIN 20 MG PO TABS: 40.0000 mg | ORAL_TABLET | Freq: Every day | ORAL | Status: DC

## 2013-09-17 ENCOUNTER — Telehealth: Payer: Self-pay | Admitting: *Deleted

## 2013-09-17 MED ORDER — LISINOPRIL-HYDROCHLOROTHIAZIDE 20-12.5 MG PO TABS: ORAL_TABLET | ORAL | Status: DC

## 2013-09-17 NOTE — Telephone Encounter (Signed)
Refill done.  

## 2013-10-31 ENCOUNTER — Other Ambulatory Visit: Payer: Self-pay | Admitting: Internal Medicine

## 2013-11-19 DIAGNOSIS — Z803 Family history of malignant neoplasm of breast: Secondary | ICD-10-CM | POA: Diagnosis not present

## 2013-11-19 DIAGNOSIS — Z1231 Encounter for screening mammogram for malignant neoplasm of breast: Secondary | ICD-10-CM | POA: Diagnosis not present

## 2013-11-27 ENCOUNTER — Other Ambulatory Visit: Payer: Self-pay | Admitting: Internal Medicine

## 2013-12-04 ENCOUNTER — Encounter: Payer: Self-pay | Admitting: Internal Medicine

## 2014-02-26 DIAGNOSIS — H2510 Age-related nuclear cataract, unspecified eye: Secondary | ICD-10-CM | POA: Diagnosis not present

## 2014-03-14 ENCOUNTER — Other Ambulatory Visit: Payer: Self-pay

## 2014-04-07 ENCOUNTER — Other Ambulatory Visit: Payer: Self-pay | Admitting: Internal Medicine

## 2014-04-16 ENCOUNTER — Other Ambulatory Visit: Payer: Self-pay | Admitting: Family Medicine

## 2014-04-16 ENCOUNTER — Other Ambulatory Visit: Payer: Medicare Other

## 2014-04-16 DIAGNOSIS — R059 Cough, unspecified: Secondary | ICD-10-CM

## 2014-04-16 DIAGNOSIS — R05 Cough: Secondary | ICD-10-CM

## 2014-05-08 ENCOUNTER — Ambulatory Visit: Payer: Medicare Other | Admitting: Family

## 2014-05-15 ENCOUNTER — Other Ambulatory Visit: Payer: Self-pay | Admitting: Internal Medicine

## 2014-05-27 ENCOUNTER — Ambulatory Visit (INDEPENDENT_AMBULATORY_CARE_PROVIDER_SITE_OTHER): Payer: Medicare Other | Admitting: Family

## 2014-05-27 ENCOUNTER — Ambulatory Visit (INDEPENDENT_AMBULATORY_CARE_PROVIDER_SITE_OTHER)
Admission: RE | Admit: 2014-05-27 | Discharge: 2014-05-27 | Disposition: A | Discharge status: Home / Self Care | Payer: Medicare Other | Source: Ambulatory Visit | Attending: Family | Admitting: Family

## 2014-05-27 ENCOUNTER — Encounter: Payer: Self-pay | Admitting: Family

## 2014-05-27 VITALS — BP 174/84 | HR 65 | Temp 97.5°F | Resp 18 | Ht 62.0 in | Wt 156.4 lb

## 2014-05-27 DIAGNOSIS — M542 Cervicalgia: Secondary | ICD-10-CM

## 2014-05-27 DIAGNOSIS — I10 Essential (primary) hypertension: Secondary | ICD-10-CM

## 2014-05-27 MED ORDER — LISINOPRIL-HYDROCHLOROTHIAZIDE 20-12.5 MG PO TABS: 1.0000 | ORAL_TABLET | Freq: Every day | ORAL | Status: DC

## 2014-05-27 MED ORDER — METHYLPREDNISOLONE (PAK) 4 MG PO TABS: ORAL_TABLET | ORAL | Status: DC

## 2014-05-27 NOTE — Patient Instructions (Signed)
Thank you for choosing [facility].  Summary/Instructions:  Your prescription(s) have been submitted to your pharmacy. Please take as directed and contact our office if you believe you are having problem(s) with the medication(s).  Please stop by radiology on the basement level of the building for your x-rays. Your results will be released to [place] (or called to you) after review, usually within 72hours after test completion. If any changes need to be made, you will be notified at that same time.  If your symptoms worsen or fail to improve, please contact our office for further instruction, or in case of emergency go directly to the emergency room at the closest medical facility.

## 2014-05-27 NOTE — Progress Notes (Signed)
Pre visit review using our clinic review tool, if applicable. No additional management support is needed unless otherwise documented below in the visit note. 

## 2014-05-27 NOTE — Assessment & Plan Note (Signed)
Patient continues to experience chronic neck pain for over one year. Previous x-rays revealed an underlying facet syndrome. Obtain new x-rays. Patient declines muscle relaxers at this time. Start Medrol Dosepak. Continue over-the-counter medication as needed for symptom relief. Pending x-rays and steroids, may consider referral to sports medicine for further management were physical therapy.

## 2014-05-27 NOTE — Assessment & Plan Note (Signed)
Hypertension is currently labile secondary to patient's running out of medication. Preventive eye exam is up-to-date. Refill lisinopril-hydrochlorothiazide and continue current dosage of 12-12.5 mg.

## 2014-05-27 NOTE — Progress Notes (Signed)
   Subjective:    Patient ID: Alexis Price, female    DOB: Dec 13, 1945, 68 y.o.   MRN: [id number]  Chief Complaint  Patient presents with  . Establish Care    was seen for neck pain by Dr. Linda Hedges and was taking flexeril which she said didn't really help, still having pain and needs BP meds,    HPI:  Alexis Price is a 68 y.o. female who presents today to establish care and discuss her neck pain and blood pressure medications.   1) Blood pressure - Currently maintained on lisinopril-HCTZ. Eye exam is up to date. Pt ran out of medication about a week ago.   BP Readings from Last 3 Encounters:  05/27/14 174/84  03/06/13 130/74  06/26/12 130/80    2) Neck pain - Chronic neck pain with varying qualities between sharp and dull in her cervical spine have been going on for about a year. The intensity of the pain ranges from 2-10/10. Has previously been placed on muscle relaxers which did help, but made her very drowsy. Denies any over the counter medication use. Turning her head to the left increases the pain. May be worse at night, but unsure. Previous x-rays were reviewed and showed facet syndrome.   No Known Allergies   Current Outpatient Prescriptions on File Prior to Visit  Medication Sig Dispense Refill  . lisinopril-hydrochlorothiazide (PRINZIDE,ZESTORETIC) 20-12.5 MG per tablet TAKE 1 TABLET BY MOUTH EVERY DAY 30 tablet 1  . simvastatin (ZOCOR) 20 MG tablet Take 2 tablets (40 mg total) by mouth at bedtime. 180 tablet 3   No current facility-administered medications on file prior to visit.    Past Medical History  Diagnosis Date  . HYPERLIPIDEMIA   . HYPERTENSION   . Postmenopausal atrophic vaginitis 05/12/2011    Review of Systems  Eyes:       Denies changes in vision.  Respiratory: Negative for cough, chest tightness and shortness of breath.   Cardiovascular: Negative for chest pain, palpitations and leg swelling.  Musculoskeletal: Positive for neck pain.      Objective:    BP 174/84 mmHg  Pulse 65  Temp(Src) 97.5 F (36.4 C) (Oral)  Resp 18  Ht 5\' 2"  (1.575 m)  Wt 156 lb 6.4 oz (70.943 kg)  BMI 28.60 kg/m2  SpO2 96% Nursing note and vital signs reviewed.  Physical Exam  Constitutional: She is oriented to person, place, and time. She appears well-developed and well-nourished. No distress.  Neck:  No obvious deformity, discoloration, or edema of the neck noted. No palpable tenderness noted on cervical spine. Tenderness elicited to the right of her cervical spine along the pathway of the upper trapezius muscle. Mild muscle spasm is noted as well.  Cardiovascular: Normal rate, regular rhythm, normal heart sounds and intact distal pulses.   Pulmonary/Chest: Effort normal and breath sounds normal.  Neurological: She is alert and oriented to person, place, and time.  Skin: Skin is warm and dry.  Psychiatric: She has a normal mood and affect. Her behavior is normal. Judgment and thought content normal.       Assessment & Plan:

## 2014-05-28 ENCOUNTER — Encounter: Payer: Self-pay | Admitting: Family

## 2014-07-03 ENCOUNTER — Other Ambulatory Visit: Payer: Self-pay

## 2014-07-03 MED ORDER — SIMVASTATIN 20 MG PO TABS: 40.0000 mg | ORAL_TABLET | Freq: Every day | ORAL | Status: DC

## 2014-08-11 ENCOUNTER — Telehealth: Payer: Self-pay

## 2014-08-11 NOTE — Telephone Encounter (Signed)
Per the dtr, the patient declined a flu shot this season

## 2014-11-10 ENCOUNTER — Telehealth: Payer: Self-pay

## 2014-11-10 MED ORDER — SIMVASTATIN 20 MG PO TABS: 40.0000 mg | ORAL_TABLET | Freq: Every day | ORAL | Status: DC

## 2014-11-10 NOTE — Telephone Encounter (Signed)
Pt requesting refill of simvastatin. Last OV was 05/27/14. Please advise

## 2014-11-10 NOTE — Telephone Encounter (Signed)
Medication refilled

## 2014-11-24 ENCOUNTER — Other Ambulatory Visit: Payer: Self-pay

## 2015-03-18 DIAGNOSIS — Z803 Family history of malignant neoplasm of breast: Secondary | ICD-10-CM | POA: Diagnosis not present

## 2015-03-18 DIAGNOSIS — Z1231 Encounter for screening mammogram for malignant neoplasm of breast: Secondary | ICD-10-CM | POA: Diagnosis not present

## 2015-03-18 LAB — HM MAMMOGRAPHY: HM Mammogram: NEGATIVE

## 2015-04-06 ENCOUNTER — Encounter: Payer: Self-pay | Admitting: Family

## 2015-07-31 ENCOUNTER — Other Ambulatory Visit: Payer: Self-pay | Admitting: Family

## 2015-08-01 DIAGNOSIS — S63502A Unspecified sprain of left wrist, initial encounter: Secondary | ICD-10-CM | POA: Diagnosis not present

## 2015-08-13 DIAGNOSIS — S52532A Colles' fracture of left radius, initial encounter for closed fracture: Secondary | ICD-10-CM | POA: Diagnosis not present

## 2015-08-20 ENCOUNTER — Ambulatory Visit (INDEPENDENT_AMBULATORY_CARE_PROVIDER_SITE_OTHER): Payer: Medicare Other | Admitting: Family

## 2015-08-20 ENCOUNTER — Encounter: Payer: Self-pay | Admitting: Family

## 2015-08-20 ENCOUNTER — Other Ambulatory Visit: Payer: Self-pay | Admitting: Family

## 2015-08-20 VITALS — BP 160/92 | HR 64 | Temp 98.1°F | Resp 16 | Ht 62.0 in | Wt 172.0 lb

## 2015-08-20 DIAGNOSIS — I10 Essential (primary) hypertension: Secondary | ICD-10-CM | POA: Diagnosis not present

## 2015-08-20 MED ORDER — LISINOPRIL-HYDROCHLOROTHIAZIDE 20-12.5 MG PO TABS: 2.0000 | ORAL_TABLET | Freq: Every day | ORAL | Status: DC

## 2015-08-20 NOTE — Assessment & Plan Note (Signed)
Blood pressure remains above goal 150/90 with current regimen. Question patient compliance based on refills. Encouraged to take blood pressure at home. Increase lisinopril-hydrochlorothiazide. Follow-up in one month or sooner if needed.

## 2015-08-20 NOTE — Patient Instructions (Addendum)
Thank you for choosing [facility].  Summary/Instructions:  Your prescription(s) have been submitted to your pharmacy or been printed and provided for you. Please take as directed and contact our office if you believe you are having problem(s) with the medication(s) or have any questions.  If your symptoms worsen or fail to improve, please contact our office for further instruction, or in case of emergency go directly to the emergency room at the closest medical facility.   Hypertension Hypertension, commonly called high blood pressure, is when the force of blood pumping through your arteries is too strong. Your arteries are the blood vessels that carry blood from your heart throughout your body. A blood pressure reading consists of a higher number over a lower number, such as 110/72. The higher number (systolic) is the pressure inside your arteries when your heart pumps. The lower number (diastolic) is the pressure inside your arteries when your heart relaxes. Ideally you want your blood pressure below 120/80. Hypertension forces your heart to work harder to pump blood. Your arteries may become narrow or stiff. Having untreated or uncontrolled hypertension can cause heart attack, stroke, kidney disease, and other problems. RISK FACTORS Some risk factors for high blood pressure are controllable. Others are not.  Risk factors you cannot control include:   Race. You may be at higher risk if you are African American.  Age. Risk increases with age.  Gender. Men are at higher risk than women before age 2 years. After age 4, women are at higher risk than men. Risk factors you can control include:  Not getting enough exercise or physical activity.  Being overweight.  Getting too much fat, sugar, calories, or salt in your diet.  Drinking too much alcohol. SIGNS AND SYMPTOMS Hypertension does not usually cause signs or symptoms. Extremely high blood pressure (hypertensive crisis) may  cause headache, anxiety, shortness of breath, and nosebleed. DIAGNOSIS To check if you have hypertension, your health care provider will measure your blood pressure while you are seated, with your arm held at the level of your heart. It should be measured at least twice using the same arm. Certain conditions can cause a difference in blood pressure between your right and left arms. A blood pressure reading that is higher than normal on one occasion does not mean that you need treatment. If it is not clear whether you have high blood pressure, you may be asked to return on a different day to have your blood pressure checked again. Or, you may be asked to monitor your blood pressure at home for 1 or more weeks. TREATMENT Treating high blood pressure includes making lifestyle changes and possibly taking medicine. Living a healthy lifestyle can help lower high blood pressure. You may need to change some of your habits. Lifestyle changes may include:  Following the DASH diet. This diet is high in fruits, vegetables, and whole grains. It is low in salt, red meat, and added sugars.  Keep your sodium intake below 2,300 mg per day.  Getting at least 30-45 minutes of aerobic exercise at least 4 times per week.  Losing weight if necessary.  Not smoking.  Limiting alcoholic beverages.  Learning ways to reduce stress. Your health care provider may prescribe medicine if lifestyle changes are not enough to get your blood pressure under control, and if one of the following is true:  You are 53-51 years of age and your systolic blood pressure is above 140.  You are 3 years of age or older,  and your systolic blood pressure is above 150.  Your diastolic blood pressure is above 90.  You have diabetes, and your systolic blood pressure is over [id number] or your diastolic blood pressure is over 90.  You have kidney disease and your blood pressure is above 140/90.  You have heart disease and your blood pressure  is above 140/90. Your personal target blood pressure may vary depending on your medical conditions, your age, and other factors. HOME CARE INSTRUCTIONS  Have your blood pressure rechecked as directed by your health care provider.   Take medicines only as directed by your health care provider. Follow the directions carefully. Blood pressure medicines must be taken as prescribed. The medicine does not work as well when you skip doses. Skipping doses also puts you at risk for problems.  Do not smoke.   Monitor your blood pressure at home as directed by your health care provider. SEEK MEDICAL CARE IF:   You think you are having a reaction to medicines taken.  You have recurrent headaches or feel dizzy.  You have swelling in your ankles.  You have trouble with your vision. SEEK IMMEDIATE MEDICAL CARE IF:  You develop a severe headache or confusion.  You have unusual weakness, numbness, or feel faint.  You have severe chest or abdominal pain.  You vomit repeatedly.  You have trouble breathing. MAKE SURE YOU:   Understand these instructions.  Will watch your condition.  Will get help right away if you are not doing well or get worse.   This information is not intended to replace advice given to you by your health care provider. Make sure you discuss any questions you have with your health care provider.   Document Released: 05/16/2005 Document Revised: 09/30/2014 Document Reviewed: 03/08/2013 Elsevier Interactive Patient Education [employer].

## 2015-08-20 NOTE — Progress Notes (Signed)
Pre visit review using our clinic review tool, if applicable. No additional management support is needed unless otherwise documented below in the visit note. 

## 2015-08-20 NOTE — Progress Notes (Signed)
   Subjective:    Patient ID: Alexis Price, female    DOB: 1945/08/30, 70 y.o.   MRN: [id number]  Chief Complaint  Patient presents with  . Hypertension    wants to talk about blood pressure when she had surgery they checked her BP and it was high    HPI:  Alexis Price is a 70 y.o. female who  has a past medical history of HYPERLIPIDEMIA; HYPERTENSION; and Postmenopausal atrophic vaginitis (05/12/2011). and presents today for an office follow up.   1.) Hypertension -Currently prescribed lisinopril-hydrochlorothiazide. Reports taking the medication as prescribed and denies adverse side effects. No symptoms of end organ damage. Not currently taking her blood pressure at home.  BP Readings from Last 3 Encounters:  08/20/15 160/92  05/27/14 174/84  03/06/13 130/74    No Known Allergies   Current Outpatient Prescriptions on File Prior to Visit  Medication Sig Dispense Refill  . simvastatin (ZOCOR) 20 MG tablet Take 2 tablets (40 mg total) by mouth at bedtime. 180 tablet 3   No current facility-administered medications on file prior to visit.    Review of Systems  Constitutional: Negative for fever and chills.  Eyes:       Negative for changes in vision.  Respiratory: Negative for cough, chest tightness and shortness of breath.   Cardiovascular: Negative for chest pain, palpitations and leg swelling.  Neurological: Negative for light-headedness and headaches.      Objective:    BP 160/92 mmHg  Pulse 64  Temp(Src) 98.1 F (36.7 C) (Oral)  Resp 16  Ht 5\' 2"  (1.575 m)  Wt 172 lb (78.019 kg)  BMI 31.45 kg/m2  SpO2 97% Nursing note and vital signs reviewed.  Physical Exam  Constitutional: She is oriented to person, place, and time. She appears well-developed and well-nourished. No distress.  Cardiovascular: Normal rate, regular rhythm, normal heart sounds and intact distal pulses.   Pulmonary/Chest: Effort normal and breath sounds normal.  Neurological: She  is alert and oriented to person, place, and time.  Skin: Skin is warm and dry.  Psychiatric: She has a normal mood and affect. Her behavior is normal. Judgment and thought content normal.       Assessment & Plan:   Problem List Items Addressed This Visit      Cardiovascular and Mediastinum   Essential hypertension - Primary    Blood pressure remains above goal 150/90 with current regimen. Question patient compliance based on refills. Encouraged to take blood pressure at home. Increase lisinopril-hydrochlorothiazide. Follow-up in one month or sooner if needed.      Relevant Medications   lisinopril-hydrochlorothiazide (PRINZIDE,ZESTORETIC) 20-12.5 MG tablet

## 2015-09-04 DIAGNOSIS — S52532D Colles' fracture of left radius, subsequent encounter for closed fracture with routine healing: Secondary | ICD-10-CM | POA: Diagnosis not present

## 2015-09-14 ENCOUNTER — Encounter: Payer: Self-pay | Admitting: Family

## 2015-09-22 ENCOUNTER — Encounter: Payer: Medicare Other | Admitting: Family

## 2015-09-24 ENCOUNTER — Other Ambulatory Visit: Payer: Self-pay | Admitting: Family

## 2015-10-02 DIAGNOSIS — S52532D Colles' fracture of left radius, subsequent encounter for closed fracture with routine healing: Secondary | ICD-10-CM | POA: Diagnosis not present

## 2016-04-18 ENCOUNTER — Other Ambulatory Visit: Payer: Self-pay | Admitting: Family

## 2016-05-22 ENCOUNTER — Other Ambulatory Visit: Payer: Self-pay | Admitting: Family

## 2016-06-22 ENCOUNTER — Other Ambulatory Visit: Payer: Self-pay | Admitting: Family

## 2016-08-03 ENCOUNTER — Other Ambulatory Visit: Payer: Self-pay | Admitting: Family

## 2016-08-03 ENCOUNTER — Other Ambulatory Visit: Payer: Self-pay | Admitting: *Deleted

## 2016-08-03 MED ORDER — LISINOPRIL-HYDROCHLOROTHIAZIDE 20-12.5 MG PO TABS: 2.0000 | ORAL_TABLET | Freq: Every day | ORAL | 0 refills | Status: DC

## 2016-08-12 ENCOUNTER — Telehealth: Payer: Self-pay | Admitting: Family

## 2016-08-12 NOTE — Telephone Encounter (Signed)
Attempted to call patient to schedule awv. Phone has been disconnected.

## 2016-08-29 ENCOUNTER — Other Ambulatory Visit: Payer: Self-pay | Admitting: Family

## 2016-09-08 ENCOUNTER — Other Ambulatory Visit: Payer: Self-pay | Admitting: Family

## 2016-09-09 ENCOUNTER — Other Ambulatory Visit: Payer: Self-pay | Admitting: Family

## 2016-09-09 ENCOUNTER — Telehealth: Payer: Self-pay | Admitting: Family

## 2016-09-09 MED ORDER — LISINOPRIL-HYDROCHLOROTHIAZIDE 20-12.5 MG PO TABS: ORAL_TABLET | ORAL | 0 refills | Status: DC

## 2016-09-09 NOTE — Telephone Encounter (Signed)
Rx sent 

## 2016-09-09 NOTE — Telephone Encounter (Signed)
Pt would like enough medication to get her through to her appt, she was notified she needs to keep this appointment for any future refills   lisinopril-hydrochlorothiazide (PRINZIDE,ZESTORETIC) 20-12.5 MG tablet

## 2016-09-20 ENCOUNTER — Ambulatory Visit (INDEPENDENT_AMBULATORY_CARE_PROVIDER_SITE_OTHER): Payer: Medicare Other | Admitting: Family

## 2016-09-20 ENCOUNTER — Encounter: Payer: Self-pay | Admitting: Family

## 2016-09-20 VITALS — BP 130/72 | HR 64 | Temp 97.4°F | Ht 62.0 in | Wt 159.0 lb

## 2016-09-20 DIAGNOSIS — I10 Essential (primary) hypertension: Secondary | ICD-10-CM

## 2016-09-20 DIAGNOSIS — E782 Mixed hyperlipidemia: Secondary | ICD-10-CM

## 2016-09-20 DIAGNOSIS — Z23 Encounter for immunization: Secondary | ICD-10-CM | POA: Diagnosis not present

## 2016-09-20 DIAGNOSIS — Z7289 Other problems related to lifestyle: Secondary | ICD-10-CM

## 2016-09-20 DIAGNOSIS — Z0000 Encounter for general adult medical examination without abnormal findings: Secondary | ICD-10-CM | POA: Diagnosis not present

## 2016-09-20 NOTE — Progress Notes (Signed)
Subjective:    Patient ID: Alexis Price, female    DOB: 1945/10/23, 71 y.o.   MRN: [id number]  Chief Complaint  Patient presents with  . Annual Exam    HPI:  Alexis Price is a 71 y.o. female who presents today for a Medicare Annual Wellness/Physical exam.    1) Health Maintenance -   Diet - Averaging about 3 meals per day consisting of a regular diet; Caffeine intake of about 1-2 cups per day.   Exercise - walks and strength training; about 3-4 x per week.   2) Preventative Exams / Immunizations:  Dental -- Up to date  Vision -- Up to date    Health Maintenance  Topic Date Due  . Hepatitis C Screening  14-Mar-1946  . COLONOSCOPY  08/10/1995  . PNA vac Low Risk Adult (1 of 2 - PCV13) 08/10/2010  . DEXA SCAN  08/28/2017 (Originally 08/10/2010)  . INFLUENZA VACCINE  12/28/2016  . MAMMOGRAM  03/17/2017  . TETANUS/TDAP  06/26/2022      Immunization History  Administered Date(s) Administered  . Influenza Split 05/11/2011  . Influenza, High Dose Seasonal PF 03/06/2013  . Influenza, Seasonal, Injecte, Preservative Fre 06/26/2012  . Pneumococcal Conjugate-13 09/20/2016  . Tetanus 06/26/2012    RISK FACTORS  Tobacco History  Smoking Status  . Never Smoker  Smokeless Tobacco  . Never Used     Cardiac risk factors: advanced age (older than 55 for men, 72 for women), dyslipidemia, hypertension and female gender.  Depression Screen  No flowsheet data found.   Activities of Daily Living In your present state of health, do you have any difficulty performing the following activities?:  Driving? No Managing money?  No Feeding yourself? No Getting from bed to chair? No Climbing a flight of stairs? No Preparing food and eating?: No Bathing or showering? No Getting dressed: No Getting to the toilet? No Using the toilet: No Moving around from place to place: No In the past year have you fallen or had a near fall?:No   Home Safety Has smoke detector  and wears seat belts. No excess sun exposure. Are there smokers in your home (other than you)?  No Do you feel safe at home?  Yes  Hearing Difficulties: No Do you often ask people to speak up or repeat themselves? No Do you experience ringing or noises in your ears? No  Do you have difficulty understanding soft or whispered voices? No    Cognitive Testing  Alert? Yes   Normal Appearance? Yes  Oriented to person? Yes  Place? Yes   Time? Yes  Recall of three objects?  Yes  Can perform simple calculations? Yes  Displays appropriate judgment? Yes  Can read the correct time from a watch face? Yes  Do you feel that you have a problem with memory? No  Do you often misplace items? No   Advanced Directives have been discussed with the patient? Yes   Current Physicians/Providers and Suppliers  1. Terri Piedra, FNP - Internal Medicine  Indicate any recent Medical Services you may have received from other than [facility] providers in the past year (date may be approximate).  All answers were reviewed with the patient and necessary referrals were made:  Mauricio Po, [place]   09/20/2016    No Known Allergies   Outpatient Medications Prior to Visit  Medication Sig Dispense Refill  . lisinopril-hydrochlorothiazide (PRINZIDE,ZESTORETIC) 20-12.5 MG tablet Take 2 tablets by mouth daily. Need office visit for more  refills. 60 tablet 0  . lisinopril-hydrochlorothiazide (PRINZIDE,ZESTORETIC) 20-12.5 MG tablet TAKE 2 TABLETS BY MOUTH DAILY 60 tablet 0  . simvastatin (ZOCOR) 20 MG tablet Take 2 tablets (40 mg total) by mouth at bedtime. 180 tablet 3   No facility-administered medications prior to visit.      Past Medical History:  Diagnosis Date  . HYPERLIPIDEMIA   . HYPERTENSION   . Postmenopausal atrophic vaginitis 05/12/2011     Past Surgical History:  Procedure Laterality Date  . Emergent colectomy with creation of colosomy    . flex sigmoidoscopy  1998  . [place] of colostomy with  salpingectomy and lysis of adhesion  04/2005  . TONSILLECTOMY     age 89  . TUBAL LIGATION  1976     Family History  Problem Relation Age of Onset  . Hypertension Mother   . Heart disease Mother   . Coronary artery disease Mother     valve repalcements     Social History   Social History  . Marital status: Married    Spouse name: N/A  . Number of children: 2  . Years of education: 16   Occupational History  . office manager    Social History Main Topics  . Smoking status: Never Smoker  . Smokeless tobacco: Never Used  . Alcohol use Yes     Comment: 3-4 times per week  . Drug use: No  . Sexual activity: Yes    Partners: Male   Other Topics Concern  . Not on file   Social History Narrative   HSG, [employer] - night school. Married '67. 2 sons ' '69, '77. 4 grand-daughters. 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    Denies abuse and feels safe at home.      Review of Systems  Constitutional: Denies fever, chills, fatigue, or significant weight gain/loss. HENT: Head: Denies headache or neck pain Ears: Denies changes in hearing, ringing in ears, earache, drainage Nose: Denies discharge, stuffiness, itching, nosebleed, sinus pain Throat: Denies sore throat, hoarseness, dry mouth, sores, thrush Eyes: Denies loss/changes in vision, pain, redness, blurry/double vision, flashing lights Cardiovascular: Denies chest pain/discomfort, tightness, palpitations, shortness of breath with activity, difficulty lying down, swelling, sudden awakening with shortness of breath Respiratory: Denies shortness of breath, cough, sputum production, wheezing Gastrointestinal: Denies dysphasia, heartburn, change in appetite, nausea, change in bowel habits, rectal bleeding, constipation, diarrhea, yellow skin or eyes Genitourinary: Denies frequency, urgency, burning/pain, blood in urine, incontinence, change in urinary strength. Musculoskeletal: Denies muscle/joint pain, stiffness, back pain,  redness or swelling of joints, trauma Skin: Denies rashes, lumps, itching, dryness, color changes, or hair/nail changes Neurological: Denies dizziness, fainting, seizures, weakness, numbness, tingling, tremor Psychiatric - Denies nervousness, stress, depression or memory loss Endocrine: Denies heat or cold intolerance, sweating, frequent urination, excessive thirst, changes in appetite Hematologic: Denies ease of bruising or bleeding    Objective:     BP 130/72 (BP Location: Left Arm, Patient Position: Sitting, Cuff Size: Normal)   Pulse 64   Temp 97.4 F (36.3 C) (Oral)   Ht 5\' 2"  (1.575 m)   Wt 159 lb (72.1 kg)   SpO2 98%   BMI 29.08 kg/m  Nursing note and vital signs reviewed.  Physical Exam  Constitutional: She is oriented to person, place, and time. She appears well-developed and well-nourished.  HENT:  Head: Normocephalic.  Right Ear: Hearing, tympanic membrane, external ear and ear canal normal.  Left Ear: Hearing, tympanic membrane, external ear and ear canal normal.  Nose: Nose normal.  Mouth/Throat: Uvula is midline, oropharynx is clear and moist and mucous membranes are normal.  Eyes: Conjunctivae and EOM are normal. Pupils are equal, round, and reactive to light.  Neck: Neck supple. No JVD present. No tracheal deviation present. No thyromegaly present.  Cardiovascular: Normal rate, regular rhythm, normal heart sounds and intact distal pulses.   Pulmonary/Chest: Effort normal and breath sounds normal.  Abdominal: Soft. Bowel sounds are normal. She exhibits no distension and no mass. There is no tenderness. There is no rebound and no guarding.  Musculoskeletal: Normal range of motion. She exhibits no edema or tenderness.  Lymphadenopathy:    She has no cervical adenopathy.  Neurological: She is alert and oriented to person, place, and time. She has normal reflexes. No cranial nerve deficit. She exhibits normal muscle tone. Coordination normal.  Skin: Skin is warm and  dry.  Psychiatric: She has a normal mood and affect. Her behavior is normal. Judgment and thought content normal.       Assessment & Plan:   During the course of the visit the patient was educated and counseled about appropriate screening and preventive services including:    Pneumococcal vaccine   Influenza vaccine  Colorectal cancer screening  Nutrition counseling   Diet review for nutrition referral? Yes ____  Not Indicated _X___   Patient Instructions (the written plan) was given to the patient.  Medicare Attestation I have personally reviewed: The patient's medical and social history Their use of alcohol, tobacco or illicit drugs Their current medications and supplements The patient's functional ability including ADLs,fall risks, home safety risks, cognitive, and hearing and visual impairment Diet and physical activities Evidence for depression or mood disorders  The patient's weight, height, BMI,  have been recorded in the chart.  I have made referrals, counseling, and provided education to the patient based on review of the above and I have provided the patient with a written personalized care plan for preventive services.     Problem List Items Addressed This Visit      Cardiovascular and Mediastinum   Essential hypertension    Blood pressure appears adequate control current medication regimen and no adverse side effects. Continue current dosages lisinopril-hydrochlorothiazide. Encouraged to monitor blood pressure at home and follow low-sodium diet.      Relevant Orders   CBC   Comprehensive metabolic panel     Other   Hyperlipidemia    Self discontinued simvastatin and currently maintained with lifestyle management. Lipid profile obtained. Continue lifestyle management pending lipid profile results.      Relevant Orders   Lipid panel   Medicare annual wellness visit, subsequent - Primary    Reviewed and updated patient's medical, surgical, family and  social history. Medications and allergies were also reviewed. Basic screenings for depression, activities of daily living, hearing, cognition and safety were performed. Provider list was updated and health plan was provided to the patient.   Prevnar updated today. Due for colon cancer screening with Cologuard ordered. Obtain hepatitis C antibody for hepatitis C screening. Declines DEXA. All other screenings are up-to-date per recommendations.  Overall well exam with risk factors for cardiovascular disease including hypertension and hyperlipidemia. Self discontinued simvastatin. Blood pressure appears adequate control current medication regimen. Recommend weight loss of about 5% to improve her overall health. Continue other healthy lifestyle behaviors and choices. Follow-up prevention exam in 1 year. Follow-up office visit pending blood work and for chronic conditions.        Other Visit  Diagnoses    Other problems related to lifestyle       Relevant Orders   Hepatitis C antibody   Need for vaccination with 13-polyvalent pneumococcal conjugate vaccine       Relevant Orders   Pneumococcal conjugate vaccine 13-valent IM (Completed)       I have discontinued Ms. Ohm's simvastatin. I am also having her maintain her lisinopril-hydrochlorothiazide.   Follow-up: Return in about 6 months (around 03/22/2017), or if symptoms worsen or fail to improve.   Mauricio Po, FNP

## 2016-09-20 NOTE — Progress Notes (Signed)
Pre visit review using our clinic review tool, if applicable. No additional management support is needed unless otherwise documented below in the visit note. 

## 2016-09-20 NOTE — Assessment & Plan Note (Signed)
Blood pressure appears adequate control current medication regimen and no adverse side effects. Continue current dosages lisinopril-hydrochlorothiazide. Encouraged to monitor blood pressure at home and follow low-sodium diet.

## 2016-09-20 NOTE — Patient Instructions (Signed)
Thank you for choosing [facility].  SUMMARY AND INSTRUCTIONS:  Please continue to take your medication as prescribed.   Optimize your calcium (1,200 mg / day) and Vitamin D (1,000 units daily)  Medication:  Your prescription(s) have been submitted to your pharmacy or been printed and provided for you. Please take as directed and contact our office if you believe you are having problem(s) with the medication(s) or have any questions.  Labs:  Please stop by the lab on the lower level of the building for your blood work. Your results will be released to [facility] (or called to you) after review, usually within 72 hours after test completion. If any changes need to be made, you will be notified at that same time.  1.) The lab is open from 7:30am to 5:30 pm Monday-Friday 2.) No appointment is necessary 3.) Fasting (if needed) is 6-8 hours after food and drink; black coffee and water are okay   Follow up:  If your symptoms worsen or fail to improve, please contact our office for further instruction, or in case of emergency go directly to the emergency room at the closest medical facility.    Health Maintenance  Topic Date Due  . Hepatitis C Screening  1946/02/26  . COLONOSCOPY  08/10/1995  . DEXA SCAN  08/10/2010  . PNA vac Low Risk Adult (1 of 2 - PCV13) 08/10/2010  . INFLUENZA VACCINE  12/28/2016  . MAMMOGRAM  03/17/2017  . TETANUS/TDAP  06/26/2022    Health Maintenance, Female Adopting a healthy lifestyle and getting preventive care can go a long way to promote health and wellness. Talk with your health care provider about what schedule of regular examinations is right for you. This is a good chance for you to check in with your provider about disease prevention and staying healthy. In between checkups, there are plenty of things you can do on your own. Experts have done a lot of research about which lifestyle changes and preventive measures are most likely to keep you  healthy. Ask your health care provider for more information. Weight and diet Eat a healthy diet  Be sure to include plenty of vegetables, fruits, low-fat dairy products, and lean protein.  Do not eat a lot of foods high in solid fats, added sugars, or salt.  Get regular exercise. This is one of the most important things you can do for your health.  Most adults should exercise for at least 150 minutes each week. The exercise should increase your heart rate and make you sweat (moderate-intensity exercise).  Most adults should also do strengthening exercises at least twice a week. This is in addition to the moderate-intensity exercise. Maintain a healthy weight  Body mass index (BMI) is a measurement that can be used to identify possible weight problems. It estimates body fat based on height and weight. Your health care provider can help determine your BMI and help you achieve or maintain a healthy weight.  For females 71 years of age and older:  A BMI below 18.5 is considered underweight.  A BMI of 18.5 to 24.9 is normal.  A BMI of 25 to 29.9 is considered overweight.  A BMI of 30 and above is considered obese. Watch levels of cholesterol and blood lipids  You should start having your blood tested for lipids and cholesterol at 71 years of age, then have this test every 5 years.  You may need to have your cholesterol levels checked more often if:  Your lipid  or cholesterol levels are high.  You are older than 71 years of age.  You are at high risk for heart disease. Cancer screening Lung Cancer  Lung cancer screening is recommended for adults 71-77 years old who are at high risk for lung cancer because of a history of smoking.  A yearly low-dose CT scan of the lungs is recommended for people who:  Currently smoke.  Have quit within the past 15 years.  Have at least a 30-pack-year history of smoking. A pack year is smoking an average of one pack of cigarettes a day for 1  year.  Yearly screening should continue until it has been 15 years since you quit.  Yearly screening should stop if you develop a health problem that would prevent you from having lung cancer treatment. Breast Cancer  Practice breast self-awareness. This means understanding how your breasts normally appear and feel.  It also means doing regular breast self-exams. Let your health care provider know about any changes, no matter how small.  If you are in your 71s or 30s, you should have a clinical breast exam (CBE) by a health care provider every 1-3 years as part of a regular health exam.  If you are 71 or older, have a CBE every year. Also consider having a breast X-ray (mammogram) every year.  If you have a family history of breast cancer, talk to your health care provider about genetic screening.  If you are at high risk for breast cancer, talk to your health care provider about having an MRI and a mammogram every year.  Breast cancer gene (BRCA) assessment is recommended for women who have family members with BRCA-related cancers. BRCA-related cancers include:  Breast.  Ovarian.  Tubal.  Peritoneal cancers.  Results of the assessment will determine the need for genetic counseling and BRCA1 and BRCA2 testing. Cervical Cancer  Your health care provider may recommend that you be screened regularly for cancer of the pelvic organs (ovaries, uterus, and vagina). This screening involves a pelvic examination, including checking for microscopic changes to the surface of your cervix (Pap test). You may be encouraged to have this screening done every 3 years, beginning at age 71.  For women ages 71-65, health care providers may recommend pelvic exams and Pap testing every 3 years, or they may recommend the Pap and pelvic exam, combined with testing for human papilloma virus (HPV), every 5 years. Some types of HPV increase your risk of cervical cancer. Testing for HPV may also be done on women  of any age with unclear Pap test results.  Other health care providers may not recommend any screening for nonpregnant women who are considered low risk for pelvic cancer and who do not have symptoms. Ask your health care provider if a screening pelvic exam is right for you.  If you have had past treatment for cervical cancer or a condition that could lead to cancer, you need Pap tests and screening for cancer for at least 20 years after your treatment. If Pap tests have been discontinued, your risk factors (such as having a new sexual partner) need to be reassessed to determine if screening should resume. Some women have medical problems that increase the chance of getting cervical cancer. In these cases, your health care provider may recommend more frequent screening and Pap tests. Colorectal Cancer  This type of cancer can be detected and often prevented.  Routine colorectal cancer screening usually begins at 71 years of age and continues through  71 years of age.  Your health care provider may recommend screening at an earlier age if you have risk factors for colon cancer.  Your health care provider may also recommend using home test kits to check for hidden blood in the stool.  A small camera at the end of a tube can be used to examine your colon directly (sigmoidoscopy or colonoscopy). This is done to check for the earliest forms of colorectal cancer.  Routine screening usually begins at age 81.  Direct examination of the colon should be repeated every 5-10 years through 71 years of age. However, you may need to be screened more often if early forms of precancerous polyps or small growths are found. Skin Cancer  Check your skin from head to toe regularly.  Tell your health care provider about any new moles or changes in moles, especially if there is a change in a mole's shape or color.  Also tell your health care provider if you have a mole that is larger than the size of a pencil  eraser.  Always use sunscreen. Apply sunscreen liberally and repeatedly throughout the day.  Protect yourself by wearing long sleeves, pants, a wide-brimmed hat, and sunglasses whenever you are outside. Heart disease, diabetes, and high blood pressure  High blood pressure causes heart disease and increases the risk of stroke. High blood pressure is more likely to develop in:  People who have blood pressure in the high end of the normal range (130-139/85-89 mm Hg).  People who are overweight or obese.  People who are African American.  If you are 60-61 years of age, have your blood pressure checked every 3-5 years. If you are 78 years of age or older, have your blood pressure checked every year. You should have your blood pressure measured twice-once when you are at a hospital or clinic, and once when you are not at a hospital or clinic. Record the average of the two measurements. To check your blood pressure when you are not at a hospital or clinic, you can use:  An automated blood pressure machine at a pharmacy.  A home blood pressure monitor.  If you are between 17 years and 8 years old, ask your health care provider if you should take aspirin to prevent strokes.  Have regular diabetes screenings. This involves taking a blood sample to check your fasting blood sugar level.  If you are at a normal weight and have a low risk for diabetes, have this test once every three years after 71 years of age.  If you are overweight and have a high risk for diabetes, consider being tested at a younger age or more often. Preventing infection Hepatitis B  If you have a higher risk for hepatitis B, you should be screened for this virus. You are considered at high risk for hepatitis B if:  You were born in a country where hepatitis B is common. Ask your health care provider which countries are considered high risk.  Your parents were born in a high-risk country, and you have not been immunized  against hepatitis B (hepatitis B vaccine).  You have HIV or AIDS.  You use needles to inject street drugs.  You live with someone who has hepatitis B.  You have had sex with someone who has hepatitis B.  You get hemodialysis treatment.  You take certain medicines for conditions, including cancer, organ transplantation, and autoimmune conditions. Hepatitis C  Blood testing is recommended for:  Everyone born from  1945 through 30.  Anyone with known risk factors for hepatitis C. Sexually transmitted infections (STIs)  You should be screened for sexually transmitted infections (STIs) including gonorrhea and chlamydia if:  You are sexually active and are younger than 71 years of age.  You are older than 71 years of age and your health care provider tells you that you are at risk for this type of infection.  Your sexual activity has changed since you were last screened and you are at an increased risk for chlamydia or gonorrhea. Ask your health care provider if you are at risk.  If you do not have HIV, but are at risk, it may be recommended that you take a prescription medicine daily to prevent HIV infection. This is called pre-exposure prophylaxis (PrEP). You are considered at risk if:  You are sexually active and do not regularly use condoms or know the HIV status of your partner(s).  You take drugs by injection.  You are sexually active with a partner who has HIV. Talk with your health care provider about whether you are at high risk of being infected with HIV. If you choose to begin PrEP, you should first be tested for HIV. You should then be tested every 3 months for as long as you are taking PrEP. Pregnancy  If you are premenopausal and you may become pregnant, ask your health care provider about preconception counseling.  If you may become pregnant, take 400 to 800 micrograms (mcg) of folic acid every day.  If you want to prevent pregnancy, talk to your health care  provider about birth control (contraception). Osteoporosis and menopause  Osteoporosis is a disease in which the bones lose minerals and strength with aging. This can result in serious bone fractures. Your risk for osteoporosis can be identified using a bone density scan.  If you are 66 years of age or older, or if you are at risk for osteoporosis and fractures, ask your health care provider if you should be screened.  Ask your health care provider whether you should take a calcium or vitamin D supplement to lower your risk for osteoporosis.  Menopause may have certain physical symptoms and risks.  Hormone replacement therapy may reduce some of these symptoms and risks. Talk to your health care provider about whether hormone replacement therapy is right for you. Follow these instructions at home:  Schedule regular health, dental, and eye exams.  Stay current with your immunizations.  Do not use any tobacco products including cigarettes, chewing tobacco, or electronic cigarettes.  If you are pregnant, do not drink alcohol.  If you are breastfeeding, limit how much and how often you drink alcohol.  Limit alcohol intake to no more than 1 drink per day for nonpregnant women. One drink equals 12 ounces of beer, 5 ounces of wine, or 1 ounces of hard liquor.  Do not use street drugs.  Do not share needles.  Ask your health care provider for help if you need support or information about quitting drugs.  Tell your health care provider if you often feel depressed.  Tell your health care provider if you have ever been abused or do not feel safe at home. This information is not intended to replace advice given to you by your health care provider. Make sure you discuss any questions you have with your health care provider. Document Released: 11/29/2010 Document Revised: 10/22/2015 Document Reviewed: 02/17/2015 Elsevier Interactive Patient Education  2017 [employer]  Directive Advance  directives are legal documents that let you make choices ahead of time about your health care and medical treatment in case you become unable to communicate for yourself. Advance directives are a way for you to communicate your wishes to family, friends, and health care providers. This can help convey your decisions about end-of-life care if you become unable to communicate. Discussing and writing advance directives should happen over time rather than all at once. Advance directives can be changed depending on your situation and what you want, even after you have signed the advance directives. If you do not have an advance directive, some states assign family decision makers to act on your behalf based on how closely you are related to them. Each state has its own laws regarding advance directives. You may want to check with your health care provider, attorney, or state representative about the laws in your state. There are different types of advance directives, such as:  Medical power of attorney.  Living will.  Do not resuscitate (DNR) or do not attempt resuscitation (DNAR) order. Health care proxy and medical power of attorney A health care proxy, also called a health care agent, is a person who is appointed to make medical decisions for you in cases in which you are unable to make the decisions yourself. Generally, people choose someone they know well and trust to represent their preferences. Make sure to ask this person for an agreement to act as your proxy. A proxy may have to exercise judgment in the event of a medical decision for which your wishes are not known. A medical power of attorney is a legal document that names your health care proxy. Depending on the laws in your state, after the document is written, it may also need to be:  Signed.  Notarized.  Dated.  Copied.  Witnessed.  Incorporated into your medical record. You may also want to appoint someone to  manage your financial affairs in a situation in which you are unable to do so. This is called a durable power of attorney for finances. It is a separate legal document from the durable power of attorney for health care. You may choose the same person or someone different from your health care proxy to act as your agent in financial matters. If you do not appoint a proxy, or if there is a concern that the proxy is not acting in your best interests, a court-appointed guardian may be designated to act on your behalf. Living will A living will is a set of instructions documenting your wishes about medical care when you cannot express them yourself. Health care providers should keep a copy of your living will in your medical record. You may want to give a copy to family members or friends. To alert caregivers in case of an emergency, you can place a card in your wallet to let them know that you have a living will and where they can find it. A living will is used if you become:  Terminally ill.  Incapacitated.  Unable to communicate or make decisions. Items to consider in your living will include:  The use or non-use of life-sustaining equipment, such as dialysis machines and breathing machines (ventilators).  A DNR or DNAR order, which is the instruction not to use cardiopulmonary resuscitation (CPR) if breathing or heartbeat stops.  The use or non-use of tube feeding.  Withholding of food and fluids.  Comfort (palliative) care when the goal becomes comfort rather than a cure.  Organ and tissue donation. A living will does not give instructions for distributing your money and property if you should pass away. It is recommended that you seek the advice of a lawyer when writing a will. Decisions about taxes, beneficiaries, and asset distribution will be legally binding. This process can relieve your family and friends of any concerns surrounding disputes or questions that may come up about the  distribution of your assets. DNR or DNAR A DNR or DNAR order is a request not to have CPR in the event that your heart stops beating or you stop breathing. If a DNR or DNAR order has not been made and shared, a health care provider will try to help any patient whose heart has stopped or who has stopped breathing. If you plan to have surgery, talk with your health care provider about how your DNR or DNAR order will be followed if problems occur. Summary  Advance directives are the legal documents that allow you to make choices ahead of time about your health care and medical treatment in case you become unable to communicate for yourself.  The process of discussing and writing advance directives should happen over time. You can change the advance directives, even after you have signed them.  Advance directives include DNR or DNAR orders, living wills, and designating an agent as your medical power of attorney. This information is not intended to replace advice given to you by your health care provider. Make sure you discuss any questions you have with your health care provider. Document Released: 08/23/2007 Document Revised: 04/04/2016 Document Reviewed: 04/04/2016 Elsevier Interactive Patient Education  2017 [employer].

## 2016-09-20 NOTE — Assessment & Plan Note (Signed)
Reviewed and updated patient's medical, surgical, family and social history. Medications and allergies were also reviewed. Basic screenings for depression, activities of daily living, hearing, cognition and safety were performed. Provider list was updated and health plan was provided to the patient.   Prevnar updated today. Due for colon cancer screening with Cologuard ordered. Obtain hepatitis C antibody for hepatitis C screening. Declines DEXA. All other screenings are up-to-date per recommendations.  Overall well exam with risk factors for cardiovascular disease including hypertension and hyperlipidemia. Self discontinued simvastatin. Blood pressure appears adequate control current medication regimen. Recommend weight loss of about 5% to improve her overall health. Continue other healthy lifestyle behaviors and choices. Follow-up prevention exam in 1 year. Follow-up office visit pending blood work and for chronic conditions.

## 2016-09-20 NOTE — Assessment & Plan Note (Signed)
Self discontinued simvastatin and currently maintained with lifestyle management. Lipid profile obtained. Continue lifestyle management pending lipid profile results.

## 2016-10-13 ENCOUNTER — Other Ambulatory Visit (INDEPENDENT_AMBULATORY_CARE_PROVIDER_SITE_OTHER): Payer: Medicare Other

## 2016-10-13 DIAGNOSIS — E782 Mixed hyperlipidemia: Secondary | ICD-10-CM

## 2016-10-13 DIAGNOSIS — Z7289 Other problems related to lifestyle: Secondary | ICD-10-CM | POA: Diagnosis not present

## 2016-10-13 DIAGNOSIS — I10 Essential (primary) hypertension: Secondary | ICD-10-CM | POA: Diagnosis not present

## 2016-10-13 LAB — CBC
HCT: 35.3 % — ABNORMAL LOW (ref 36.0–46.0)
Hemoglobin: 12.2 g/dL (ref 12.0–15.0)
MCHC: 34.6 g/dL (ref 30.0–36.0)
MCV: 90.2 fl (ref 78.0–100.0)
Platelets: 279 10*3/uL (ref 150.0–400.0)
RBC: 3.91 Mil/uL (ref 3.87–5.11)
RDW: 12.5 % (ref 11.5–15.5)
WBC: 4.5 10*3/uL (ref 4.0–10.5)

## 2016-10-13 LAB — COMPREHENSIVE METABOLIC PANEL
ALT: 10 U/L (ref 0–35)
AST: 16 U/L (ref 0–37)
Albumin: 4.5 g/dL (ref 3.5–5.2)
Alkaline Phosphatase: 45 U/L (ref 39–117)
BUN: 26 mg/dL — ABNORMAL HIGH (ref 6–23)
CHLORIDE: 102 meq/L (ref 96–112)
CO2: 24 mEq/L (ref 19–32)
CREATININE: 0.98 mg/dL (ref 0.40–1.20)
Calcium: 10 mg/dL (ref 8.4–10.5)
GFR: 59.43 mL/min — ABNORMAL LOW (ref >60.00)
GLUCOSE: 114 mg/dL — AB (ref 70–99)
Potassium: 4.5 mEq/L (ref 3.5–5.1)
SODIUM: 135 meq/L (ref 135–145)
TOTAL PROTEIN: 7 g/dL (ref 6.0–8.3)
Total Bilirubin: 0.5 mg/dL (ref 0.2–1.2)

## 2016-10-13 LAB — LIPID PANEL
Cholesterol: 319 mg/dL — ABNORMAL HIGH (ref 0–200)
HDL: 62.1 mg/dL (ref >39.00)
LDL Cholesterol: 237 mg/dL — ABNORMAL HIGH (ref 0–99)
NonHDL: 256.78
Total CHOL/HDL Ratio: 5
Triglycerides: 100 mg/dL (ref 0.0–149.0)
VLDL: 20 mg/dL (ref 0.0–40.0)

## 2016-10-14 ENCOUNTER — Encounter: Payer: Self-pay | Admitting: Family

## 2016-10-14 LAB — HEPATITIS C ANTIBODY: HCV Ab: NEGATIVE

## 2016-10-14 MED ORDER — ROSUVASTATIN CALCIUM 20 MG PO TABS: 20.0000 mg | ORAL_TABLET | Freq: Every day | ORAL | 2 refills | Status: DC

## 2016-10-17 ENCOUNTER — Other Ambulatory Visit: Payer: Self-pay | Admitting: Family

## 2016-10-17 ENCOUNTER — Telehealth: Payer: Self-pay | Admitting: *Deleted

## 2016-10-17 MED ORDER — ATORVASTATIN CALCIUM 20 MG PO TABS: 20.0000 mg | ORAL_TABLET | Freq: Every day | ORAL | 1 refills | Status: DC

## 2016-10-17 NOTE — Telephone Encounter (Signed)
Pt left msg on triage stating the crestor that was rx is too expensive. Req cheaper alternative...Johny Chess

## 2016-10-17 NOTE — Telephone Encounter (Signed)
Lipitor sent to pharmacy.

## 2016-10-17 NOTE — Telephone Encounter (Signed)
Notified pt w/Greg response.../lmb 

## 2016-11-11 ENCOUNTER — Other Ambulatory Visit: Payer: Self-pay | Admitting: Family

## 2016-12-19 ENCOUNTER — Other Ambulatory Visit: Payer: Self-pay | Admitting: Family

## 2016-12-23 ENCOUNTER — Telehealth: Payer: Self-pay

## 2016-12-23 NOTE — Telephone Encounter (Signed)
Noted  

## 2016-12-23 NOTE — Telephone Encounter (Signed)
Pt cologuard (order number [id number]) is being cancelled.  Please advise of any action you would like to take.

## 2017-01-19 ENCOUNTER — Other Ambulatory Visit: Payer: Self-pay | Admitting: Family

## 2017-02-15 ENCOUNTER — Other Ambulatory Visit: Payer: Self-pay | Admitting: Family

## 2017-05-11 ENCOUNTER — Other Ambulatory Visit: Payer: Self-pay | Admitting: *Deleted

## 2017-05-11 MED ORDER — ATORVASTATIN CALCIUM 20 MG PO TABS: ORAL_TABLET | ORAL | 0 refills | Status: DC

## 2017-05-16 ENCOUNTER — Ambulatory Visit (INDEPENDENT_AMBULATORY_CARE_PROVIDER_SITE_OTHER): Payer: Medicare Other | Admitting: Family Medicine

## 2017-05-16 ENCOUNTER — Encounter: Payer: Self-pay | Admitting: Family Medicine

## 2017-05-16 VITALS — BP 136/72 | HR 64 | Temp 98.6°F | Ht 62.0 in | Wt 159.0 lb

## 2017-05-16 DIAGNOSIS — R519 Headache, unspecified: Secondary | ICD-10-CM | POA: Insufficient documentation

## 2017-05-16 DIAGNOSIS — R51 Headache: Secondary | ICD-10-CM | POA: Diagnosis not present

## 2017-05-16 MED ORDER — METHYLPREDNISOLONE ACETATE 40 MG/ML IJ SUSP
40.0000 mg | Freq: Once | INTRAMUSCULAR | Status: AC
Start: 2017-05-16 — End: 2017-05-16
  Administered 2017-05-16: 40 mg via INTRAMUSCULAR

## 2017-05-16 NOTE — Patient Instructions (Signed)
Please try tylenol for the pain. You can alternate this with aleve.  Please let us know if your symptoms do no improve

## 2017-05-16 NOTE — Assessment & Plan Note (Signed)
This is new for her but not the worst pain of her life. Does not appear to be temporal arteritis. Pain doesn't appear to be related to trigeminal neuralgia. Possible for tension-type headache. No recent infections.  - IM depo  - Counseled on supportive care - if no improvement then could consider Ct head.

## 2017-05-16 NOTE — Progress Notes (Signed)
Alexis Price - 71 y.o. female MRN [id number]  Date of birth: 1945-10-28  SUBJECTIVE:  Including CC & ROS.  Chief Complaint  Patient presents with  . Headache    Alexis Price is a 71 y.o. female that is presenting with a headache. Onset two weeks ago. Admits to light sensitivity. She takes Aleve with no improvement.  Admits to chills and nausea worse at night and first thing in the morning. Denies body aches or fevers. Denies any temporal pain. No changes in her vision. Denies any significant history of previous headaches. No head trauma. The pain is in the right frontal region but she feels it globally.. The pain is throbbing in nature. It is intermittent. There is no radiation down her neck. Denies any significant history of stress.   Review of Systems  Constitutional: Negative for fever.  Respiratory: Negative for cough.   Cardiovascular: Negative for chest pain.  Musculoskeletal: Negative for back pain.  Skin: Negative for color change.  Neurological: Positive for headaches. Negative for dizziness and weakness.  Hematological: Negative for adenopathy.  Psychiatric/Behavioral: Negative for agitation.    HISTORY: Past Medical, Surgical, Social, and Family History Reviewed & Updated per EMR.   Pertinent Historical Findings include:  Past Medical History:  Diagnosis Date  . HYPERLIPIDEMIA   . HYPERTENSION   . Postmenopausal atrophic vaginitis 05/12/2011    Past Surgical History:  Procedure Laterality Date  . Emergent colectomy with creation of colosomy    . flex sigmoidoscopy  1998  . [place] of colostomy with salpingectomy and lysis of adhesion  04/2005  . TONSILLECTOMY     age 3  . TUBAL LIGATION  1976    No Known Allergies  Family History  Problem Relation Age of Onset  . Hypertension Mother   . Heart disease Mother   . Coronary artery disease Mother        valve repalcements     Social History   Socioeconomic History  . Marital status: Married      Spouse name: Not on file  . Number of children: 2  . Years of education: 64  . Highest education level: Not on file  Social Needs  . Financial resource strain: Not on file  . Food insecurity - worry: Not on file  . Food insecurity - inability: Not on file  . Transportation needs - medical: Not on file  . Transportation needs - non-medical: Not on file  Occupational History  . Occupation: Glass blower/designer  Tobacco Use  . Smoking status: Never Smoker  . Smokeless tobacco: Never Used  Substance and Sexual Activity  . Alcohol use: Yes    Comment: 3-4 times per week  . Drug use: No  . Sexual activity: Yes    Partners: Male  Other Topics Concern  . 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 2 sons ' '69, '77. 4 grand-daughters. 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    Denies abuse and feels safe at home.      PHYSICAL EXAM:  VS: BP 136/72 (BP Location: Left Arm, Patient Position: Sitting, Cuff Size: Normal)   Pulse 64   Temp 98.6 F (37 C) (Oral)   Ht 5\' 2"  (1.575 m)   Wt 159 lb (72.1 kg)   SpO2 99%   BMI 29.08 kg/m  Physical Exam Gen: NAD, alert, cooperative with exam, well-appearing ENT: normal lips, normal nasal mucosa,  Eye: normal EOM, normal conjunctiva and lids,  pupils equal and reactive extraocular movements intact CV:  no edema, +2 pedal pulses, regular rate and rhythm   Resp: no accessory muscle use, non-labored, clear to auscultation bilaterally Skin: no rashes, no areas of induration  Neuro: normal tone, normal sensation to touch, cranial nerves II through XII intact, normal strength in upper extremities bilaterally, normal sensation upper extremities, normal pincer grasp, normal strength resistance with shrug, normal neck range of motion Psych:  normal insight, alert and oriented MSK: Normal gait, normal strength      ASSESSMENT & PLAN:   Generalized headaches This is new for her but not the worst pain of her life.  Does not appear to be temporal arteritis. Pain doesn't appear to be related to trigeminal neuralgia. Possible for tension-type headache. No recent infections.  - IM depo  - Counseled on supportive care - if no improvement then could consider Ct head.

## 2017-05-19 ENCOUNTER — Other Ambulatory Visit: Payer: Self-pay | Admitting: *Deleted

## 2017-05-19 MED ORDER — LISINOPRIL-HYDROCHLOROTHIAZIDE 20-12.5 MG PO TABS: 2.0000 | ORAL_TABLET | Freq: Every day | ORAL | 0 refills | Status: DC

## 2017-06-03 ENCOUNTER — Encounter (HOSPITAL_COMMUNITY): Payer: Self-pay | Admitting: Emergency Medicine

## 2017-06-03 ENCOUNTER — Emergency Department (HOSPITAL_COMMUNITY): Payer: Medicare Other

## 2017-06-03 ENCOUNTER — Emergency Department (HOSPITAL_COMMUNITY)
Admission: EM | Admit: 2017-06-03 | Discharge: 2017-06-03 | Disposition: A | Discharge status: Home / Self Care | Payer: Medicare Other | Attending: Emergency Medicine | Admitting: Emergency Medicine

## 2017-06-03 DIAGNOSIS — G43909 Migraine, unspecified, not intractable, without status migrainosus: Secondary | ICD-10-CM | POA: Diagnosis not present

## 2017-06-03 DIAGNOSIS — E785 Hyperlipidemia, unspecified: Secondary | ICD-10-CM | POA: Diagnosis not present

## 2017-06-03 DIAGNOSIS — Z79899 Other long term (current) drug therapy: Secondary | ICD-10-CM | POA: Insufficient documentation

## 2017-06-03 DIAGNOSIS — I10 Essential (primary) hypertension: Secondary | ICD-10-CM | POA: Insufficient documentation

## 2017-06-03 DIAGNOSIS — H538 Other visual disturbances: Secondary | ICD-10-CM | POA: Diagnosis not present

## 2017-06-03 DIAGNOSIS — R110 Nausea: Secondary | ICD-10-CM | POA: Diagnosis not present

## 2017-06-03 DIAGNOSIS — R51 Headache: Secondary | ICD-10-CM | POA: Insufficient documentation

## 2017-06-03 DIAGNOSIS — R519 Headache, unspecified: Secondary | ICD-10-CM

## 2017-06-03 DIAGNOSIS — H539 Unspecified visual disturbance: Secondary | ICD-10-CM | POA: Diagnosis not present

## 2017-06-03 LAB — BASIC METABOLIC PANEL
Anion gap: 12 (ref 5–15)
BUN: 16 mg/dL (ref 6–20)
CO2: 24 mmol/L (ref 22–32)
CREATININE: 0.82 mg/dL (ref 0.44–1.00)
Calcium: 9.4 mg/dL (ref 8.9–10.3)
Chloride: 99 mmol/L — ABNORMAL LOW (ref 101–111)
GFR calc non Af Amer: >60 mL/min (ref >60)
Glucose, Bld: 129 mg/dL — ABNORMAL HIGH (ref 65–99)
POTASSIUM: 4.2 mmol/L (ref 3.5–5.1)
SODIUM: 135 mmol/L (ref 135–145)

## 2017-06-03 LAB — CBC
HCT: 32.6 % — ABNORMAL LOW (ref 36.0–46.0)
HEMOGLOBIN: 10.5 g/dL — AB (ref 12.0–15.0)
MCH: 29.1 pg (ref 26.0–34.0)
MCHC: 32.2 g/dL (ref 30.0–36.0)
MCV: 90.3 fL (ref 78.0–100.0)
Platelets: 357 10*3/uL (ref 150–400)
RBC: 3.61 MIL/uL — AB (ref 3.87–5.11)
RDW: 12.2 % (ref 11.5–15.5)
WBC: 8 10*3/uL (ref 4.0–10.5)

## 2017-06-03 LAB — SEDIMENTATION RATE: Sed Rate: 15 mm/hr (ref 0–22)

## 2017-06-03 MED ORDER — FLUORESCEIN SODIUM 1 MG OP STRP
1.0000 | ORAL_STRIP | Freq: Once | OPHTHALMIC | Status: AC
  Administered 2017-06-03: 1 via OPHTHALMIC
  Filled 2017-06-03: qty 1

## 2017-06-03 MED ORDER — SODIUM CHLORIDE 0.9 % IV BOLUS (SEPSIS)
1000.0000 mL | Freq: Once | INTRAVENOUS | Status: AC
  Administered 2017-06-03: 1000 mL via INTRAVENOUS

## 2017-06-03 MED ORDER — DIPHENHYDRAMINE HCL 25 MG PO CAPS
25.0000 mg | ORAL_CAPSULE | Freq: Once | ORAL | Status: DC
  Filled 2017-06-03: qty 1

## 2017-06-03 MED ORDER — DIPHENHYDRAMINE HCL 50 MG/ML IJ SOLN
25.0000 mg | Freq: Once | INTRAMUSCULAR | Status: AC
  Administered 2017-06-03: 25 mg via INTRAVENOUS
  Filled 2017-06-03: qty 1

## 2017-06-03 MED ORDER — TETRACAINE HCL 0.5 % OP SOLN
2.0000 [drp] | Freq: Once | OPHTHALMIC | Status: AC
  Administered 2017-06-03: 2 [drp] via OPHTHALMIC
  Filled 2017-06-03: qty 4

## 2017-06-03 MED ORDER — TRAMADOL HCL 50 MG PO TABS: 50.0000 mg | ORAL_TABLET | Freq: Four times a day (QID) | ORAL | 0 refills | Status: DC | PRN

## 2017-06-03 MED ORDER — PROCHLORPERAZINE EDISYLATE 5 MG/ML IJ SOLN
10.0000 mg | Freq: Once | INTRAMUSCULAR | Status: AC
  Administered 2017-06-03: 10 mg via INTRAVENOUS
  Filled 2017-06-03: qty 2

## 2017-06-03 NOTE — ED Triage Notes (Addendum)
Last note charted in error.

## 2017-06-03 NOTE — ED Provider Notes (Signed)
[facility] EMERGENCY DEPARTMENT Provider Note   CSN: [id number] Arrival date & time: 06/03/17  5284     History   Chief Complaint Chief Complaint  Patient presents with  . Migraine    HPI Alexis Price is a 72 y.o. female.  Patient is a 72 year old female with a history of hypertension and hyperlipidemia who presents with a headache.  She states that started about a month ago.  It has been generalized and off and on type pain.  However over the last week has been more consistent and over the last 2 days it has been more localized behind the right eye.  She does feel like she has some blurriness in the right eye.  No discharge from the eye.  She has some pain over into her right temple area.  No fevers.  No neck pain.  She has had some nausea but no vomiting.  No history of similar headaches in the past.  She has been seen by her PCP last week who gave her a steroid shot and she is added Tylenol in addition to the Aleve that she normally takes.  No recent falls or trauma.  She has no ataxia.  No difficulty with motor function in her extremities.  No numbness or weakness to her extremities.      Past Medical History:  Diagnosis Date  . HYPERLIPIDEMIA   . HYPERTENSION   . Postmenopausal atrophic vaginitis 05/12/2011    Patient Active Problem List   Diagnosis Date Noted  . Generalized headaches 05/16/2017  . Medicare annual wellness visit, subsequent 09/20/2016  . Neck pain 05/27/2014  . Postmenopausal atrophic vaginitis 05/12/2011  . Routine health maintenance 05/12/2011  . Hyperlipidemia 08/24/2007  . Essential hypertension 08/24/2007    Past Surgical History:  Procedure Laterality Date  . Emergent colectomy with creation of colosomy    . flex sigmoidoscopy  1998  . [place] of colostomy with salpingectomy and lysis of adhesion  04/2005  . TONSILLECTOMY     age 50  . TUBAL LIGATION  1976    OB History    No data available       Home  Medications    Prior to Admission medications   Medication Sig Start Date End Date Taking? Authorizing Provider  acetaminophen (TYLENOL) 325 MG tablet Take 325 mg by mouth every 6 (six) hours as needed for mild pain or headache.   Yes [provider]  atorvastatin (LIPITOR) 20 MG tablet TAKE 1 TABLET(20 MG) BY MOUTH DAILY 05/11/17  Yes Lance Sell, NP  lisinopril-hydrochlorothiazide (PRINZIDE,ZESTORETIC) 20-12.5 MG tablet Take 2 tablets by mouth daily. Must see provider for future refills 05/19/17  Yes Shambley, Ashleigh N, NP  naproxen sodium (ALEVE) 220 MG tablet Take 220 mg by mouth daily.   Yes [provider]  traMADol (ULTRAM) 50 MG tablet Take 1 tablet (50 mg total) by mouth every 6 (six) hours as needed. 06/03/17   Malvin Johns, MD    Family History Family History  Problem Relation Age of Onset  . Hypertension Mother   . Heart disease Mother   . Coronary artery disease Mother        valve repalcements    Social History Social History   Tobacco Use  . Smoking status: Never Smoker  . Smokeless tobacco: Never Used  Substance Use Topics  . Alcohol use: Yes    Comment: 3-4 times per week  . Drug use: No     Allergies  Patient has no known allergies.   Review of Systems Review of Systems  Constitutional: Negative for chills, diaphoresis, fatigue and fever.  HENT: Negative for congestion, rhinorrhea and sneezing.   Eyes: Positive for visual disturbance. Negative for photophobia, pain, discharge and redness.  Respiratory: Negative for cough, chest tightness and shortness of breath.   Cardiovascular: Negative for chest pain and leg swelling.  Gastrointestinal: Positive for nausea. Negative for abdominal pain, blood in stool, diarrhea and vomiting.  Genitourinary: Negative for difficulty urinating, flank pain, frequency and hematuria.  Musculoskeletal: Negative for arthralgias and back pain.  Skin: Negative for rash.  Neurological: Positive  for headaches. Negative for dizziness, speech difficulty, weakness and numbness.     Physical Exam Updated Vital Signs BP 125/65 (BP Location: Right Arm)   Pulse 78   Temp 99.1 F (37.3 C) (Oral)   Resp 16   Ht 5\' 2"  (1.575 m)   Wt 70.3 kg (155 lb)   SpO2 100%   BMI 28.35 kg/m   Physical Exam  Constitutional: She is oriented to person, place, and time. She appears well-developed and well-nourished.  HENT:  Head: Normocephalic and atraumatic.  Eyes: Conjunctivae and EOM are normal. Pupils are equal, round, and reactive to light.  No conjunctival injection, no discharge from the eye, pressures are 16 on Tono-Pen check to the right eye.  Neck: Normal range of motion. Neck supple.  Cardiovascular: Normal rate, regular rhythm and normal heart sounds.  Pulmonary/Chest: Effort normal and breath sounds normal. No respiratory distress. She has no wheezes. She has no rales. She exhibits no tenderness.  Abdominal: Soft. Bowel sounds are normal. There is no tenderness. There is no rebound and no guarding.  Musculoskeletal: Normal range of motion. She exhibits no edema.  Lymphadenopathy:    She has no cervical adenopathy.  Neurological: She is alert and oriented to person, place, and time.  Motor 5/5 all extremities Sensation grossly intact to LT all extremities Finger to Nose intact, no pronator drift CN II-XII grossly intact    Skin: Skin is warm and dry. No rash noted.  Psychiatric: She has a normal mood and affect.     ED Treatments / Results  Labs (all labs ordered are listed, but only abnormal results are displayed) Labs Reviewed  CBC - Abnormal; Notable for the following components:      Result Value   RBC 3.61 (*)    Hemoglobin 10.5 (*)    HCT 32.6 (*)    All other components within normal limits  BASIC METABOLIC PANEL - Abnormal; Notable for the following components:   Chloride 99 (*)    Glucose, Bld 129 (*)    All other components within normal limits    SEDIMENTATION RATE    EKG  EKG Interpretation None       Radiology Ct Head Wo Contrast  Result Date: 06/03/2017 CLINICAL DATA:  Two week history of migraines. EXAM: CT HEAD WITHOUT CONTRAST TECHNIQUE: Contiguous axial images were obtained from the base of the skull through the vertex without intravenous contrast. COMPARISON:  CT head 01/12/2011. FINDINGS: Brain: No evidence of acute infarction, hemorrhage, hydrocephalus, extra-axial collection or mass lesion/mass effect. Normal for age cerebral volume. No significant white matter disease. Vascular: Calcification of the cavernous internal carotid arteries consistent with cerebrovascular atherosclerotic disease. No signs of intracranial large vessel occlusion. Skull: Normal. Negative for fracture or focal lesion. Sinuses/Orbits: No significant sinus opacity. Orbits appear unremarkable. Other: No mastoid fluid.  Calcific pannus surrounds the odontoid. IMPRESSION:  Negative exam. No significant change from priors. No cause is identified for the reported symptoms. Electronically Signed   By: Staci Righter M.D.   On: 06/03/2017 07:34    Procedures Procedures (including critical care time)  Medications Ordered in ED Medications  prochlorperazine (COMPAZINE) injection 10 mg (10 mg Intravenous Given 06/03/17 0734)  sodium chloride 0.9 % bolus 1,000 mL (0 mLs Intravenous Stopped 06/03/17 0924)  diphenhydrAMINE (BENADRYL) injection 25 mg (25 mg Intravenous Given 06/03/17 0734)  fluorescein ophthalmic strip 1 strip (1 strip Right Eye Given 06/03/17 0740)  tetracaine (PONTOCAINE) 0.5 % ophthalmic solution 2 drop (2 drops Right Eye Given 06/03/17 0740)     Initial Impression / Assessment and Plan / ED Course  I have reviewed the triage vital signs and the nursing notes.  Pertinent labs & imaging results that were available during my care of the patient were reviewed by me and considered in my medical decision making (see chart for details).     Patient  is a 72 year old female who presents with a headache.  It has been going on about a month but recently worsened over the last to 3 days.  She is neurologically intact.  She is able to ambulate without ataxia.  Her headache has improved with treatment in the emergency department.  She has no associated vomiting.  No fevers which be more suggestive of meningitis.  Her head CT is negative for mass, infarct or bleeding.  She has no suggestions of glaucoma with normal eye pressures and no vision changes.  Her sed rate is normal without other suggestions of temporal arteritis.  She was encouraged to follow-up with her PCP.  Return precautions were given.  Final Clinical Impressions(s) / ED Diagnoses   Final diagnoses:  Bad headache    ED Discharge Orders        Ordered    traMADol (ULTRAM) 50 MG tablet  Every 6 hours PRN     06/03/17 5456       Malvin Johns, MD 06/03/17 1000

## 2017-06-03 NOTE — ED Notes (Signed)
Patient transported to CT 

## 2017-06-03 NOTE — ED Notes (Signed)
Headache for 2 months sl nausea sl blurred vision

## 2017-06-03 NOTE — ED Triage Notes (Signed)
Pt reports migraine X4 weeks with no prior hx of migraines. Pt went to her PCP 2 weeks ago and was given steroid shot which has not helped, supposed to go back today but couldn't take the pain any longer. Reports some nausea, photosensitivity, and "pressure" behind her R eye. Neuro intact.  Per Dr. Betsey Holiday next room, verbal orders given for blood, will wait for CT until evaluated by EDP.

## 2017-06-08 ENCOUNTER — Encounter: Payer: Self-pay | Admitting: Family Medicine

## 2017-06-08 ENCOUNTER — Ambulatory Visit (INDEPENDENT_AMBULATORY_CARE_PROVIDER_SITE_OTHER): Payer: Medicare Other | Admitting: Family Medicine

## 2017-06-08 VITALS — BP 128/74 | HR 65 | Temp 98.6°F | Ht 62.0 in | Wt 156.0 lb

## 2017-06-08 DIAGNOSIS — R51 Headache: Secondary | ICD-10-CM

## 2017-06-08 DIAGNOSIS — R519 Headache, unspecified: Secondary | ICD-10-CM

## 2017-06-08 NOTE — Assessment & Plan Note (Addendum)
CT head was normal. She doesn't sleep well (is able to fall asleep but wakes up), takes aleve on a daily basis (for years), and hasn't had her eyes checked recently. Lab work was unrevealing for temporal arteritis   - referral to PT for possible tension component  - Counseled on sleep hygiene  - try taking a break from aleve  - could try referral to neuro if no improvement. Could consider belsomra - could try maxalt

## 2017-06-08 NOTE — Progress Notes (Signed)
Alexis Price - 72 y.o. female MRN [id number]  Date of birth: [birth year]  SUBJECTIVE:  Including CC & ROS.  Chief Complaint  Patient presents with  . Headache    Alexis Price is a 72 y.o. female that is presenting for a headache. Patient states it was present for three weeks. She went to the ER on 06/03/17, she had a Head CT and lab work completed . She was diagnosed with a migraine. She continues to have headaches-radiates behind her eyes and her temples. Has been taking aleve with no improvement. She feels the headache is constant. Has been taking aleve on a daily basis. Has not had her vision checked in some time. No trauma to her head. No nausea or vomiting.   REview of CT head from 1/5 was normal. Lab work from 1/5 showed a normal sed rate.    Review of Systems  Eyes: Negative for photophobia.  Respiratory: Negative for chest tightness.   Cardiovascular: Negative for chest pain.  Neurological: Positive for headaches. Negative for facial asymmetry and weakness.    HISTORY: Past Medical, Surgical, Social, and Family History Reviewed & Updated per EMR.   Pertinent Historical Findings include:  Past Medical History:  Diagnosis Date  . HYPERLIPIDEMIA   . HYPERTENSION   . Postmenopausal atrophic vaginitis 05/12/2011    Past Surgical History:  Procedure Laterality Date  . Emergent colectomy with creation of colosomy    . flex sigmoidoscopy  1998  . [place] of colostomy with salpingectomy and lysis of adhesion  04/2005  . TONSILLECTOMY     age 64  . TUBAL LIGATION  1976    No Known Allergies  Family History  Problem Relation Age of Onset  . Hypertension Mother   . Heart disease Mother   . Coronary artery disease Mother        valve repalcements     Social History   Socioeconomic History  . Marital status: Married    Spouse name: Not on file  . Number of children: 2  . Years of education: 25  . Highest education level: Not on file  Social Needs  .  Financial resource strain: Not on file  . Food insecurity - worry: Not on file  . Food insecurity - inability: Not on file  . Transportation needs - medical: Not on file  . Transportation needs - non-medical: Not on file  Occupational History  . Occupation: Glass blower/designer  Tobacco Use  . Smoking status: Never Smoker  . Smokeless tobacco: Never Used  Substance and Sexual Activity  . Alcohol use: Yes    Comment: 3-4 times per week  . Drug use: No  . Sexual activity: Yes    Partners: Male  Other Topics Concern  . 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 2 sons ' '69, '77. 4 grand-daughters. 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    Denies abuse and feels safe at home.      PHYSICAL EXAM:  VS: BP 128/74 (BP Location: Left Arm, Patient Position: Sitting, Cuff Size: Normal)   Pulse 65   Temp 98.6 F (37 C) (Oral)   Ht 5\' 2"  (1.575 m)   Wt 156 lb (70.8 kg)   SpO2 98%   BMI 28.53 kg/m  Physical Exam Gen: NAD, alert, cooperative with exam,  ENT: normal lips, normal nasal mucosa,  Eye: normal EOM, normal conjunctiva and lids CV:  no edema, +2 pedal pulses  Resp: no accessory muscle use, non-labored,  Skin: no rashes, no areas of induration  Neuro: normal tone, normal sensation to touch, CN 2-12 intact Psych:  normal insight, alert and oriented MSK: normal gait, normal strength      ASSESSMENT & PLAN:   Generalized headaches CT head was normal. She doesn't sleep well (is able to fall asleep but wakes up), takes aleve on a daily basis (for years), and hasn't had her eyes checked recently. Lab work was unrevealing for temporal arteritis   - referral to PT for possible tension component  - Counseled on sleep hygiene  - try taking a break from aleve  - could try referral to neuro if no improvement. Could consider belsomra - could try maxalt

## 2017-06-08 NOTE — Patient Instructions (Signed)
Please try taking a break from the aleve  You can try melatonin to help with sleep  I have made a referral to Physical therapy  Please try to get your eyes checked.  Please let me know if your symptoms don't improve in the next 3-4 weeks.

## 2017-06-12 ENCOUNTER — Telehealth: Payer: Self-pay | Admitting: *Deleted

## 2017-06-12 ENCOUNTER — Encounter: Payer: Self-pay | Admitting: Neurology

## 2017-06-12 DIAGNOSIS — J029 Acute pharyngitis, unspecified: Secondary | ICD-10-CM | POA: Diagnosis not present

## 2017-06-12 DIAGNOSIS — Z6829 Body mass index (BMI) 29.0-29.9, adult: Secondary | ICD-10-CM | POA: Diagnosis not present

## 2017-06-12 DIAGNOSIS — R51 Headache: Principal | ICD-10-CM

## 2017-06-12 DIAGNOSIS — R6884 Jaw pain: Secondary | ICD-10-CM | POA: Diagnosis not present

## 2017-06-12 DIAGNOSIS — I10 Essential (primary) hypertension: Secondary | ICD-10-CM | POA: Diagnosis not present

## 2017-06-12 DIAGNOSIS — R519 Headache, unspecified: Secondary | ICD-10-CM

## 2017-06-12 NOTE — Telephone Encounter (Signed)
Referral placed for Neurology    Copied from [facility]. Topic: Referral - Request >> Jun 12, 2017 10:34 AM Alexis Price, NT wrote: Reason for CRM: Patient states that Dr Everlene Balls told her  he would refer her to a neurologist if she was still having issues with headaches please call her at once done  [phone]

## 2017-06-18 DIAGNOSIS — M316 Other giant cell arteritis: Secondary | ICD-10-CM | POA: Diagnosis not present

## 2017-06-19 ENCOUNTER — Other Ambulatory Visit: Payer: Medicare Other

## 2017-06-19 DIAGNOSIS — R10819 Abdominal tenderness, unspecified site: Secondary | ICD-10-CM | POA: Diagnosis not present

## 2017-06-19 DIAGNOSIS — R6884 Jaw pain: Secondary | ICD-10-CM | POA: Diagnosis not present

## 2017-06-19 DIAGNOSIS — R531 Weakness: Secondary | ICD-10-CM | POA: Diagnosis not present

## 2017-06-19 DIAGNOSIS — R51 Headache: Secondary | ICD-10-CM | POA: Diagnosis not present

## 2017-06-19 DIAGNOSIS — M316 Other giant cell arteritis: Secondary | ICD-10-CM | POA: Diagnosis not present

## 2017-06-20 ENCOUNTER — Encounter: Payer: Self-pay | Admitting: Family Medicine

## 2017-06-20 ENCOUNTER — Ambulatory Visit (INDEPENDENT_AMBULATORY_CARE_PROVIDER_SITE_OTHER): Payer: Medicare Other | Admitting: Family Medicine

## 2017-06-20 VITALS — BP 138/84 | HR 68 | Temp 97.6°F | Ht 62.0 in | Wt 158.0 lb

## 2017-06-20 DIAGNOSIS — M316 Other giant cell arteritis: Secondary | ICD-10-CM

## 2017-06-20 DIAGNOSIS — E782 Mixed hyperlipidemia: Secondary | ICD-10-CM | POA: Diagnosis not present

## 2017-06-20 HISTORY — DX: Other giant cell arteritis: M31.6

## 2017-06-20 MED ORDER — ATORVASTATIN CALCIUM 20 MG PO TABS: ORAL_TABLET | ORAL | 0 refills | Status: DC

## 2017-06-20 MED ORDER — PREDNISONE 5 MG PO TABS: ORAL_TABLET | ORAL | 0 refills | Status: DC

## 2017-06-20 MED ORDER — PREDNISONE 20 MG PO TABS: 60.0000 mg | ORAL_TABLET | Freq: Every day | ORAL | 0 refills | Status: DC

## 2017-06-20 NOTE — Assessment & Plan Note (Signed)
Uncontrolled.  - refill lipitor

## 2017-06-20 NOTE — Patient Instructions (Signed)
I have placed an urgent referral to Ear, Nose and throat.  I have placed a couple steroid prescriptions. Take the 60 mg daily until next week and then start the taper next week.

## 2017-06-20 NOTE — Assessment & Plan Note (Addendum)
Has been seen by ophthalmology on Sunday and there is concern that she has temporal arteritis. Previous lab work was unrevealing for this diagnosis. She feels improvement with the oral prednisone that she has been started on. - Urgent referral to ear nose and throat for biopsy - Prednisone 60 mg for the next week. Can have a follow-up in order to check the ESR and then taper once he has returned to normal. - She will fax [place] her lab workup was performed yesterday from [place].

## 2017-06-20 NOTE — Progress Notes (Signed)
Alexis Price - 72 y.o. female MRN [id number]  Date of birth: 07/10/1945  SUBJECTIVE:  Including CC & ROS.  Chief Complaint  Patient presents with  . Headache  . Jaw Pain    Alexis Price is a 72 y.o. female that is here today for follow up regarding her headaches and jaw pain. She states she saw Dr. Delman Cheadle on 06/18/17 regarding right eye pain and vision issues. She was having jaw pain, which has improved. He prescribed prednisone with some improvement. She had lab work performed by Dr. Delman Cheadle but unable to view this today. He had recommended that she have a biopsy performed.   She needs a refill of her Lipitor. She had her lab work performed last summer that shows an elevated LDL. She has been on this medication for some time. She denies any intolerance to the medication. Denies any muscle pains.   Review of Systems  Constitutional: Negative for fever.  HENT: Negative for sore throat.   Eyes: Positive for visual disturbance.  Respiratory: Negative for cough.   Cardiovascular: Negative for chest pain.  Gastrointestinal: Negative for abdominal pain.  Musculoskeletal: Negative for gait problem.  Neurological: Positive for headaches.  Hematological: Negative for adenopathy.  Psychiatric/Behavioral: Negative for agitation.    HISTORY: Past Medical, Surgical, Social, and Family History Reviewed & Updated per EMR.   Pertinent Historical Findings include:  Past Medical History:  Diagnosis Date  . HYPERLIPIDEMIA   . HYPERTENSION   . Postmenopausal atrophic vaginitis 05/12/2011    Past Surgical History:  Procedure Laterality Date  . Emergent colectomy with creation of colosomy    . flex sigmoidoscopy  1998  . [place] of colostomy with salpingectomy and lysis of adhesion  04/2005  . TONSILLECTOMY     age 20  . TUBAL LIGATION  1976    No Known Allergies  Family History  Problem Relation Age of Onset  . Hypertension Mother   . Heart disease Mother   . Coronary artery  disease Mother        valve repalcements     Social History   Socioeconomic History  . Marital status: Married    Spouse name: Not on file  . Number of children: 2  . Years of education: 23  . Highest education level: Not on file  Social Needs  . Financial resource strain: Not on file  . Food insecurity - worry: Not on file  . Food insecurity - inability: Not on file  . Transportation needs - medical: Not on file  . Transportation needs - non-medical: Not on file  Occupational History  . Occupation: Glass blower/designer  Tobacco Use  . Smoking status: Never Smoker  . Smokeless tobacco: Never Used  Substance and Sexual Activity  . Alcohol use: Yes    Comment: 3-4 times per week  . Drug use: No  . Sexual activity: Yes    Partners: Male  Other Topics Concern  . 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 2 sons ' '69, '77. 4 grand-daughters. 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    Denies abuse and feels safe at home.      PHYSICAL EXAM:  VS: BP 138/84 (BP Location: Left Arm, Patient Position: Sitting, Cuff Size: Normal)   Pulse 68   Temp 97.6 F (36.4 C) (Oral)   Ht '5\' 2"'  (1.575 m)   Wt 158 lb (71.7 kg)   SpO2 97%   BMI 28.90 kg/m  Physical Exam Gen: NAD, alert, cooperative with exam, well-appearing ENT: normal lips, normal nasal mucosa,  Eye: normal EOM, normal conjunctiva and lids CV:  no edema, +2 pedal pulses   Resp: no accessory muscle use, non-labored,  Skin: no rashes, no areas of induration  Neuro: normal tone, normal sensation to touch Psych:  normal insight, alert and oriented MSK: normal strength, normal gait    Visual Acuity Screening   Right eye Left eye Both eyes  Without correction: 0 20/50 20/50  With correction:         ASSESSMENT & PLAN:   Temporal arteritis ([place]) Has been seen by ophthalmology on Sunday and there is concern that she has temporal arteritis. Previous lab work was unrevealing for this  diagnosis. She feels improvement with the oral prednisone that she has been started on. - Urgent referral to ear nose and throat for biopsy - Prednisone 60 mg for the next week. Can have a follow-up in order to check the ESR and then taper once he has returned to normal. - She will fax [place] her lab workup was performed yesterday from [place].  Hyperlipidemia Uncontrolled.  - refill lipitor

## 2017-06-23 DIAGNOSIS — R7982 Elevated C-reactive protein (CRP): Secondary | ICD-10-CM | POA: Diagnosis not present

## 2017-06-23 DIAGNOSIS — H5711 Ocular pain, right eye: Secondary | ICD-10-CM | POA: Diagnosis not present

## 2017-06-26 DIAGNOSIS — M316 Other giant cell arteritis: Secondary | ICD-10-CM | POA: Diagnosis not present

## 2017-06-26 DIAGNOSIS — R7982 Elevated C-reactive protein (CRP): Secondary | ICD-10-CM | POA: Diagnosis not present

## 2017-06-26 DIAGNOSIS — H5711 Ocular pain, right eye: Secondary | ICD-10-CM | POA: Diagnosis not present

## 2017-06-29 ENCOUNTER — Other Ambulatory Visit: Payer: Self-pay | Admitting: Nurse Practitioner

## 2017-06-29 MED ORDER — LISINOPRIL-HYDROCHLOROTHIAZIDE 20-12.5 MG PO TABS: 2.0000 | ORAL_TABLET | Freq: Every day | ORAL | 0 refills | Status: DC

## 2017-06-29 NOTE — Telephone Encounter (Signed)
Pt is establishing with you on 07/06/17. Please advise.

## 2017-06-29 NOTE — Telephone Encounter (Signed)
[place] for one month, but will need to keep appt

## 2017-06-29 NOTE — Telephone Encounter (Signed)
I have not sent the rx

## 2017-07-06 ENCOUNTER — Encounter: Payer: Self-pay | Admitting: Internal Medicine

## 2017-07-06 ENCOUNTER — Other Ambulatory Visit (INDEPENDENT_AMBULATORY_CARE_PROVIDER_SITE_OTHER): Payer: Medicare Other

## 2017-07-06 ENCOUNTER — Ambulatory Visit (INDEPENDENT_AMBULATORY_CARE_PROVIDER_SITE_OTHER): Payer: Medicare Other | Admitting: Internal Medicine

## 2017-07-06 VITALS — BP 112/76 | HR 98 | Temp 98.7°F | Ht 62.0 in | Wt 151.0 lb

## 2017-07-06 DIAGNOSIS — I10 Essential (primary) hypertension: Secondary | ICD-10-CM | POA: Diagnosis not present

## 2017-07-06 DIAGNOSIS — Z0000 Encounter for general adult medical examination without abnormal findings: Secondary | ICD-10-CM | POA: Diagnosis not present

## 2017-07-06 DIAGNOSIS — E782 Mixed hyperlipidemia: Secondary | ICD-10-CM

## 2017-07-06 DIAGNOSIS — M316 Other giant cell arteritis: Secondary | ICD-10-CM | POA: Diagnosis not present

## 2017-07-06 DIAGNOSIS — R739 Hyperglycemia, unspecified: Secondary | ICD-10-CM

## 2017-07-06 LAB — URINALYSIS, ROUTINE W REFLEX MICROSCOPIC
Bilirubin Urine: NEGATIVE
Hgb urine dipstick: NEGATIVE
Ketones, ur: NEGATIVE
Leukocytes, UA: NEGATIVE
Nitrite: NEGATIVE
PH: 7.5 (ref 5.0–8.0)
SPECIFIC GRAVITY, URINE: 1.01 (ref 1.000–1.030)
TOTAL PROTEIN, URINE-UPE24: NEGATIVE
URINE GLUCOSE: NEGATIVE
Urobilinogen, UA: 0.2 (ref 0.0–1.0)

## 2017-07-06 LAB — CBC WITH DIFFERENTIAL/PLATELET
BASOS ABS: 0.1 10*3/uL (ref 0.0–0.1)
Basophils Relative: 0.9 % (ref 0.0–3.0)
EOS ABS: 0.1 10*3/uL (ref 0.0–0.7)
Eosinophils Relative: 1.2 % (ref 0.0–5.0)
HEMATOCRIT: 35 % — AB (ref 36.0–46.0)
Hemoglobin: 11.8 g/dL — ABNORMAL LOW (ref 12.0–15.0)
LYMPHS ABS: 1.5 10*3/uL (ref 0.7–4.0)
LYMPHS PCT: 16.6 % (ref 12.0–46.0)
MCHC: 33.7 g/dL (ref 30.0–36.0)
MCV: 87.7 fl (ref 78.0–100.0)
Monocytes Absolute: 0.8 10*3/uL (ref 0.1–1.0)
Monocytes Relative: 9.1 % (ref 3.0–12.0)
NEUTROS ABS: 6.4 10*3/uL (ref 1.4–7.7)
NEUTROS PCT: 72.2 % (ref 43.0–77.0)
PLATELETS: 259 10*3/uL (ref 150.0–400.0)
RBC: 3.99 Mil/uL (ref 3.87–5.11)
RDW: 13.9 % (ref 11.5–15.5)
WBC: 8.9 10*3/uL (ref 4.0–10.5)

## 2017-07-06 LAB — BASIC METABOLIC PANEL
BUN: 19 mg/dL (ref 6–23)
CHLORIDE: 98 meq/L (ref 96–112)
CO2: 29 meq/L (ref 19–32)
CREATININE: 0.97 mg/dL (ref 0.40–1.20)
Calcium: 9.5 mg/dL (ref 8.4–10.5)
GFR: 60.01 mL/min (ref >60.00)
Glucose, Bld: 129 mg/dL — ABNORMAL HIGH (ref 70–99)
Potassium: 3.7 mEq/L (ref 3.5–5.1)
Sodium: 137 mEq/L (ref 135–145)

## 2017-07-06 LAB — LIPID PANEL
CHOLESTEROL: 205 mg/dL — AB (ref 0–200)
HDL: 66.3 mg/dL (ref >39.00)
NONHDL: 138.83
TRIGLYCERIDES: 222 mg/dL — AB (ref 0.0–149.0)
Total CHOL/HDL Ratio: 3
VLDL: 44.4 mg/dL — ABNORMAL HIGH (ref 0.0–40.0)

## 2017-07-06 LAB — TSH: TSH: 1.73 u[IU]/mL (ref 0.35–4.50)

## 2017-07-06 LAB — HEPATIC FUNCTION PANEL
ALT: 12 U/L (ref 0–35)
AST: 13 U/L (ref 0–37)
Albumin: 4.1 g/dL (ref 3.5–5.2)
Alkaline Phosphatase: 59 U/L (ref 39–117)
BILIRUBIN DIRECT: 0.1 mg/dL (ref 0.0–0.3)
TOTAL PROTEIN: 7.2 g/dL (ref 6.0–8.3)
Total Bilirubin: 0.5 mg/dL (ref 0.2–1.2)

## 2017-07-06 LAB — LDL CHOLESTEROL, DIRECT: Direct LDL: 95 mg/dL

## 2017-07-06 LAB — SEDIMENTATION RATE: SED RATE: 54 mm/h — AB (ref 0–30)

## 2017-07-06 LAB — HEMOGLOBIN A1C: Hgb A1c MFr Bld: 6.5 % (ref 4.6–6.5)

## 2017-07-06 MED ORDER — PREDNISONE 10 MG PO TABS: 10.0000 mg | ORAL_TABLET | Freq: Every day | ORAL | 0 refills | Status: DC

## 2017-07-06 MED ORDER — ATORVASTATIN CALCIUM 20 MG PO TABS
ORAL_TABLET | ORAL | 3 refills | Status: DC
Start: 2017-07-06 — End: 2018-01-26

## 2017-07-06 MED ORDER — LISINOPRIL-HYDROCHLOROTHIAZIDE 20-12.5 MG PO TABS: 2.0000 | ORAL_TABLET | Freq: Every day | ORAL | 3 refills | Status: DC

## 2017-07-06 NOTE — Patient Instructions (Addendum)
Please take all new medication as prescribed - the prednisone 10 mg daily (indefinitely for noow)  You will be contacted regarding the referral for: Rheumatology  Please continue all other medications as before, and refills have been done if requested.  Please have the pharmacy call with any other refills you may need.  Please continue your efforts at being more active, low cholesterol diet, and weight control.  You are otherwise up to date with prevention measures today.  Please keep your appointments with your specialists as you may have planned  Please go to the LAB in the Basement (turn left off the elevator) for the tests to be done today  You will be contacted by phone if any changes need to be made immediately.  Otherwise, you will receive a letter about your results with an explanation, but please check with MyChart first.  Please remember to sign up for MyChart if you have not done so, as this will be important to you in the future with finding out test results, communicating by private email, and scheduling acute appointments online when needed.  Please return in 6 months, or sooner if needed

## 2017-07-06 NOTE — Progress Notes (Signed)
Subjective:    Patient ID: Alexis Price, female    DOB: 06/17/1945, 72 y.o.   MRN: [id number]  HPI  Here for wellness and f/u;  Overall doing ok;  Pt denies Chest pain, worsening SOB, DOE, wheezing, orthopnea, PND, worsening LE edema, palpitations, dizziness or syncope.  Pt denies neurological change such as new headache, facial or extremity weakness.  Pt denies polydipsia, polyuria, or low sugar symptoms. Pt states overall good compliance with treatment and medications, good tolerability, and has been trying to follow appropriate diet.  Pt denies worsening depressive symptoms, suicidal ideation or panic. No fever, night sweats, wt loss, loss of appetite, or other constitutional symptoms.  Pt states good ability with ADL's, has low fall risk, home safety reviewed and adequate, no other significant changes in hearing or vision, and only occasionally active with exercise.  Declines flu shot. Plans to do cologuard soon, has kit. Does not want mammogram or dxa at this time.   Has recent biopsy proven temporal arteritis, most recent sed rate curiously normal, tx with prednisone and now HA's recurrent today after last prednisone yesterday when HA was improved, has already significant right vision loss dx per optho;  Has neurology referral in place but needs prednisone and rheum referral.  Has optho f/u next wk. Past Medical History:  Diagnosis Date  . Essential hypertension 08/24/2007   Qualifier: Diagnosis of  By: Tiney Rouge CMA, Ellison Hughs    . HYPERLIPIDEMIA   . Hyperlipidemia 08/24/2007   Qualifier: Diagnosis of  By: Tiney Rouge CMA, Ellison Hughs   medicaation - simvastatin 20 mg  On for > 1 year with no adverse side affects   . HYPERTENSION   . Postmenopausal atrophic vaginitis 05/12/2011  . Temporal arteritis ([place]) 06/20/2017   Past Surgical History:  Procedure Laterality Date  . Emergent colectomy with creation of colosomy    . flex sigmoidoscopy  1998  . [place] of colostomy with salpingectomy and lysis  of adhesion  04/2005  . TONSILLECTOMY     age 50  . TUBAL LIGATION  1976    reports that  has never smoked. she has never used smokeless tobacco. She reports that she drinks alcohol. She reports that she does not use drugs. family history includes Coronary artery disease in her mother; Heart disease in her mother; Hypertension in her mother. No Known Allergies Current Outpatient Medications on File Prior to Visit  Medication Sig Dispense Refill  . acetaminophen (TYLENOL) 325 MG tablet Take 325 mg by mouth every 6 (six) hours as needed for mild pain or headache.    . naproxen sodium (ALEVE) 220 MG tablet Take 220 mg by mouth daily.     No current facility-administered medications on file prior to visit.    Review of Systems Constitutional: Negative for other unusual diaphoresis, sweats, appetite or weight changes HENT: Negative for other worsening hearing loss, ear pain, facial swelling, mouth sores or neck stiffness.   Eyes: Negative for other worsening pain, redness or other visual disturbance.  Respiratory: Negative for other stridor or swelling Cardiovascular: Negative for other palpitations or other chest pain  Gastrointestinal: Negative for worsening diarrhea or loose stools, blood in stool, distention or other pain Genitourinary: Negative for hematuria, flank pain or other change in urine volume.  Musculoskeletal: Negative for myalgias or other joint swelling.  Skin: Negative for other color change, or other wound or worsening drainage.  Neurological: Negative for other syncope or numbness. Hematological: Negative for other adenopathy or swelling Psychiatric/Behavioral: Negative for hallucinations,  other worsening agitation, SI, self-injury, or new decreased concentration All other system neg per pt    Objective:   Physical Exam BP 112/76   Pulse 98   Temp 98.7 F (37.1 C) (Oral)   Ht _0  (1.575 m)   Wt 151 lb (68.5 kg)   SpO2 100%   BMI 27.62 kg/m  VS noted,    Constitutional: Pt is oriented to person, place, and time. Appears well-developed and well-nourished, in no significant distress and comfortable Head: Normocephalic and atraumatic  Eyes: Conjunctivae and EOM are normal. Pupils are equal, round, and reactive to light Right Ear: External ear normal without discharge Left Ear: External ear normal without discharge Nose: Nose without discharge or deformity Mouth/Throat: Oropharynx is without other ulcerations and moist  Neck: Normal range of motion. Neck supple. No JVD present. No tracheal deviation present or significant neck LA or mass Cardiovascular: Normal rate, regular rhythm, normal heart sounds and intact distal pulses.   Pulmonary/Chest: WOB normal and breath sounds without rales or wheezing  Abdominal: Soft. Bowel sounds are normal. NT. No HSM  Musculoskeletal: Normal range of motion. Exhibits no edema Lymphadenopathy: Has no other cervical adenopathy.  Neurological: Pt is alert and oriented to person, place, and time. Pt has normal reflexes. No cranial nerve deficit. Motor grossly intact, Gait intact Skin: Skin is warm and dry. No rash noted or new ulcerations Psychiatric:  Has normal mood and affect. Behavior is normal without agitation No other exam findings      Assessment & Plan:

## 2017-07-06 NOTE — Assessment & Plan Note (Signed)
Very severe, on lower dose lipitor, for lipids with labs today, goal < 100

## 2017-07-08 NOTE — Assessment & Plan Note (Signed)
Recent dx, finished short course high dose prednisone, now for 10 mg daily indefinitely for now, refer rheumatology

## 2017-07-08 NOTE — Assessment & Plan Note (Signed)

## 2017-07-08 NOTE — Assessment & Plan Note (Signed)
Lab Results  Component Value Date   HGBA1C 6.5 07/06/2017  stable overall by history and exam, recent data reviewed with pt, and pt to continue medical treatment as before,  to f/u any worsening symptoms or concerns

## 2017-07-08 NOTE — Assessment & Plan Note (Signed)
BP Readings from Last 3 Encounters:  07/06/17 112/76  06/20/17 138/84  06/08/17 128/74  stable overall by history and exam, recent data reviewed with pt, and pt to continue medical treatment as before,  to f/u any worsening symptoms or concerns

## 2017-07-17 DIAGNOSIS — R51 Headache: Secondary | ICD-10-CM | POA: Diagnosis not present

## 2017-07-17 DIAGNOSIS — M316 Other giant cell arteritis: Secondary | ICD-10-CM | POA: Diagnosis not present

## 2017-07-31 DIAGNOSIS — M316 Other giant cell arteritis: Secondary | ICD-10-CM | POA: Diagnosis not present

## 2017-08-16 DIAGNOSIS — Z6828 Body mass index (BMI) 28.0-28.9, adult: Secondary | ICD-10-CM | POA: Diagnosis not present

## 2017-08-16 DIAGNOSIS — E663 Overweight: Secondary | ICD-10-CM | POA: Diagnosis not present

## 2017-08-16 DIAGNOSIS — M316 Other giant cell arteritis: Secondary | ICD-10-CM | POA: Diagnosis not present

## 2017-09-13 DIAGNOSIS — E663 Overweight: Secondary | ICD-10-CM | POA: Diagnosis not present

## 2017-09-13 DIAGNOSIS — Z6828 Body mass index (BMI) 28.0-28.9, adult: Secondary | ICD-10-CM | POA: Diagnosis not present

## 2017-09-13 DIAGNOSIS — M316 Other giant cell arteritis: Secondary | ICD-10-CM | POA: Diagnosis not present

## 2017-09-26 ENCOUNTER — Encounter

## 2017-09-26 ENCOUNTER — Ambulatory Visit: Payer: Medicare Other | Admitting: Neurology

## 2017-10-25 DIAGNOSIS — M316 Other giant cell arteritis: Secondary | ICD-10-CM | POA: Diagnosis not present

## 2017-10-25 DIAGNOSIS — E663 Overweight: Secondary | ICD-10-CM | POA: Diagnosis not present

## 2017-10-25 DIAGNOSIS — Z6829 Body mass index (BMI) 29.0-29.9, adult: Secondary | ICD-10-CM | POA: Diagnosis not present

## 2017-11-20 DIAGNOSIS — M316 Other giant cell arteritis: Secondary | ICD-10-CM | POA: Diagnosis not present

## 2017-12-14 DIAGNOSIS — M316 Other giant cell arteritis: Secondary | ICD-10-CM | POA: Diagnosis not present

## 2017-12-14 DIAGNOSIS — Z6829 Body mass index (BMI) 29.0-29.9, adult: Secondary | ICD-10-CM | POA: Diagnosis not present

## 2017-12-14 DIAGNOSIS — Z7952 Long term (current) use of systemic steroids: Secondary | ICD-10-CM | POA: Diagnosis not present

## 2017-12-14 DIAGNOSIS — G5603 Carpal tunnel syndrome, bilateral upper limbs: Secondary | ICD-10-CM | POA: Diagnosis not present

## 2017-12-14 DIAGNOSIS — E663 Overweight: Secondary | ICD-10-CM | POA: Diagnosis not present

## 2018-01-26 ENCOUNTER — Ambulatory Visit (INDEPENDENT_AMBULATORY_CARE_PROVIDER_SITE_OTHER)
Admission: RE | Admit: 2018-01-26 | Discharge: 2018-01-26 | Disposition: A | Discharge status: Home / Self Care | Payer: Medicare Other | Source: Ambulatory Visit | Attending: Internal Medicine | Admitting: Internal Medicine

## 2018-01-26 ENCOUNTER — Encounter: Payer: Self-pay | Admitting: Internal Medicine

## 2018-01-26 ENCOUNTER — Ambulatory Visit (INDEPENDENT_AMBULATORY_CARE_PROVIDER_SITE_OTHER): Payer: Medicare Other | Admitting: Internal Medicine

## 2018-01-26 VITALS — BP 144/84 | HR 65 | Temp 98.5°F | Ht 62.0 in | Wt 159.0 lb

## 2018-01-26 DIAGNOSIS — I10 Essential (primary) hypertension: Secondary | ICD-10-CM

## 2018-01-26 DIAGNOSIS — Z7952 Long term (current) use of systemic steroids: Secondary | ICD-10-CM

## 2018-01-26 DIAGNOSIS — E782 Mixed hyperlipidemia: Secondary | ICD-10-CM

## 2018-01-26 DIAGNOSIS — M79651 Pain in right thigh: Secondary | ICD-10-CM | POA: Diagnosis not present

## 2018-01-26 DIAGNOSIS — M1611 Unilateral primary osteoarthritis, right hip: Secondary | ICD-10-CM | POA: Diagnosis not present

## 2018-01-26 DIAGNOSIS — R739 Hyperglycemia, unspecified: Secondary | ICD-10-CM

## 2018-01-26 MED ORDER — ATORVASTATIN CALCIUM 20 MG PO TABS: ORAL_TABLET | ORAL | 3 refills | Status: DC

## 2018-01-26 MED ORDER — TIZANIDINE HCL 2 MG PO TABS: 2.0000 mg | ORAL_TABLET | Freq: Four times a day (QID) | ORAL | 1 refills | Status: DC | PRN

## 2018-01-26 NOTE — Assessment & Plan Note (Signed)
stable overall by history and exam, recent data reviewed with pt, and pt to continue medical treatment as before,  to f/u any worsening symptoms or concerns, cont lipitor, for labs with next visit

## 2018-01-26 NOTE — Assessment & Plan Note (Signed)
To wean as per rheum, consider dxa

## 2018-01-26 NOTE — Assessment & Plan Note (Signed)
stable overall by history and exam, recent data reviewed with pt, and pt to continue medical treatment as before,  to f/u any worsening symptoms or concerns' Lab Results  Component Value Date   HGBA1C 6.5 07/06/2017

## 2018-01-26 NOTE — Assessment & Plan Note (Signed)
Mild elevated today, likely reactive, o/w stable overall by history and exam, recent data reviewed with pt, and pt to continue medical treatment as before,  to f/u any worsening symptoms or concerns BP Readings from Last 3 Encounters:  01/26/18 (!) 144/84  07/06/17 112/76  06/20/17 138/84

## 2018-01-26 NOTE — Patient Instructions (Signed)
Please take all new medication as prescribed - the muscle relaxer  Please continue all other medications as before, and refills have been done if requested.  Please have the pharmacy call with any other refills you may need.  Please keep your appointments with your specialists as you may have planned  Please go to the XRAY Department in the Basement (go straight as you get off the elevator) for the x-ray testing  You will be contacted by phone if any changes need to be made immediately.  Otherwise, you will receive a letter about your results with an explanation, but please check with MyChart first.  Please remember to sign up for MyChart if you have not done so, as this will be important to you in the future with finding out test results, communicating by private email, and scheduling acute appointments online when needed.  Please see Dr Tamala Julian of Sports medicine in this office if the xrays are negative and pain persists or worsens

## 2018-01-26 NOTE — Assessment & Plan Note (Signed)
Etiology unclear, diff includes muscular strain, underliying hip or pelvis abnormal, or even AVN of right femur; pt states she gets by with tylenol and does not want other pain med, for xrays as orderd, consider f/u sport med for u/s of area if not improved

## 2018-01-26 NOTE — Progress Notes (Signed)
Subjective:    Patient ID: Alexis Price, female    DOB: [birth year], 72 y.o.   MRN: [id number]  HPI  hee to f/u with concern for "possible thyroid" due to hair texture and thinning, and significant wt gain, has been on prednisone, also with some bilat trace pedal edema.  Denies hyper or hypo thyroid symptoms such as voice, skin change. Also with 10 days sharp pain unsual to the right medial groin area just below the inguinal ligament, radidates to the mid meidal thigh, constan, mild to mod, worse to stand and walk, no sense it will give out or fall down.  Pt continues to have recurring LBP without change in severity, bowel or bladder change, fever, wt loss,  worsening LE pain/numbness/weakness, gait change or falls, and does not think related.  Denies urinary symptoms such as dysuria, frequency, urgency, flank pain, hematuria or n/v, fever, chills.  No fever or recent trauma or rash.  No misteps or overuse.  Has had significant prednisone for 8 mo with rheumatology, No recent xrays per pt per rheum.  Note from rheum here suggests arthritis as cause of pain.  Past Medical History:  Diagnosis Date  . Essential hypertension 08/24/2007   Qualifier: Diagnosis of  By: Tiney Rouge CMA, Ellison Hughs    . HYPERLIPIDEMIA   . Hyperlipidemia 08/24/2007   Qualifier: Diagnosis of  By: Tiney Rouge CMA, Ellison Hughs   medicaation - simvastatin 20 mg  On for > 1 year with no adverse side affects   . HYPERTENSION   . Postmenopausal atrophic vaginitis 05/12/2011  . Temporal arteritis ([place]) 06/20/2017   Past Surgical History:  Procedure Laterality Date  . Emergent colectomy with creation of colosomy    . flex sigmoidoscopy  1998  . [place] of colostomy with salpingectomy and lysis of adhesion  04/2005  . TONSILLECTOMY     age 34  . TUBAL LIGATION  1976    reports that she has never smoked. She has never used smokeless tobacco. She reports that she drinks alcohol. She reports that she does not use drugs. family history  includes Coronary artery disease in her mother; Heart disease in her mother; Hypertension in her mother. No Known Allergies Current Outpatient Medications on File Prior to Visit  Medication Sig Dispense Refill  . acetaminophen (TYLENOL) 325 MG tablet Take 325 mg by mouth every 6 (six) hours as needed for mild pain or headache.    . lisinopril-hydrochlorothiazide (PRINZIDE,ZESTORETIC) 20-12.5 MG tablet Take 2 tablets by mouth daily. Must see provider for future refills 180 tablet 3  . naproxen sodium (ALEVE) 220 MG tablet Take 220 mg by mouth daily.    . predniSONE (DELTASONE) 10 MG tablet Take 1 tablet (10 mg total) by mouth daily with breakfast. 90 tablet 0   No current facility-administered medications on file prior to visit.    Review of Systems  Constitutional: Negative for other unusual diaphoresis or sweats HENT: Negative for ear discharge or swelling Eyes: Negative for other worsening visual disturbances Respiratory: Negative for stridor or other swelling  Gastrointestinal: Negative for worsening distension or other blood Genitourinary: Negative for retention or other urinary change Musculoskeletal: Negative for other MSK pain or swelling Skin: Negative for color change or other new lesions Neurological: Negative for worsening tremors and other numbness  Psychiatric/Behavioral: Negative for worsening agitation or other fatigue All other system neg per pt    Objective:   Physical Exam BP (!) 144/84   Pulse 65   Temp 98.5 F (36.9 C) (  Oral)   Ht 5\' 2"  (1.575 m)   Wt 159 lb (72.1 kg)   SpO2 97%   BMI 29.08 kg/m  VS noted, overwt, not ill appearing but in pain with standing up Constitutional: Pt appears in NAD HENT: Head: NCAT.  Right Ear: External ear normal.  Left Ear: External ear normal.  Eyes: . Pupils are equal, round, and reactive to light. Conjunctivae and EOM are normal Nose: without d/c or deformity Neck: Neck supple. Gross normal ROM Cardiovascular: Normal  rate and regular rhythm.   Pulmonary/Chest: Effort normal and breath sounds without rales or wheezing.  Abd:  Soft, NT, ND, + BS, no organomegaly Spine nontender in midline and paravertebral Right medial thigh with mild tender nondiscrete without rash, swelling, mass or LA Neurological: Pt is alert. At baseline orientation, motor grossly intact Skin: Skin is warm. No rashes, other new lesions, no LE edema Psychiatric: Pt behavior is normal without agitation  No other exam findings Lab Results  Component Value Date   WBC 8.9 07/06/2017   HGB 11.8 (L) 07/06/2017   HCT 35.0 (L) 07/06/2017   PLT 259.0 07/06/2017   GLUCOSE 129 (H) 07/06/2017   CHOL 205 (H) 07/06/2017   TRIG 222.0 (H) 07/06/2017   HDL 66.30 07/06/2017   LDLDIRECT 95.0 07/06/2017   LDLCALC 237 (H) 10/13/2016   ALT 12 07/06/2017   AST 13 07/06/2017   NA 137 07/06/2017   K 3.7 07/06/2017   CL 98 07/06/2017   CREATININE 0.97 07/06/2017   BUN 19 07/06/2017   CO2 29 07/06/2017   TSH 1.73 07/06/2017   HGBA1C 6.5 07/06/2017       Assessment & Plan:

## 2018-01-31 DIAGNOSIS — Z6830 Body mass index (BMI) 30.0-30.9, adult: Secondary | ICD-10-CM | POA: Diagnosis not present

## 2018-01-31 DIAGNOSIS — G5603 Carpal tunnel syndrome, bilateral upper limbs: Secondary | ICD-10-CM | POA: Diagnosis not present

## 2018-01-31 DIAGNOSIS — M316 Other giant cell arteritis: Secondary | ICD-10-CM | POA: Diagnosis not present

## 2018-01-31 DIAGNOSIS — E669 Obesity, unspecified: Secondary | ICD-10-CM | POA: Diagnosis not present

## 2018-01-31 DIAGNOSIS — Z7952 Long term (current) use of systemic steroids: Secondary | ICD-10-CM | POA: Diagnosis not present

## 2018-02-15 DIAGNOSIS — M1611 Unilateral primary osteoarthritis, right hip: Secondary | ICD-10-CM | POA: Diagnosis not present

## 2018-03-26 DIAGNOSIS — M1611 Unilateral primary osteoarthritis, right hip: Secondary | ICD-10-CM | POA: Diagnosis not present

## 2018-04-13 ENCOUNTER — Other Ambulatory Visit: Payer: Self-pay

## 2018-05-10 ENCOUNTER — Telehealth: Payer: Self-pay | Admitting: *Deleted

## 2018-05-10 NOTE — Telephone Encounter (Signed)
Called patient to schedule AWV, patient declined.  

## 2018-05-14 DIAGNOSIS — H2513 Age-related nuclear cataract, bilateral: Secondary | ICD-10-CM | POA: Diagnosis not present

## 2018-06-28 DIAGNOSIS — G5603 Carpal tunnel syndrome, bilateral upper limbs: Secondary | ICD-10-CM | POA: Diagnosis not present

## 2018-06-28 DIAGNOSIS — Z7952 Long term (current) use of systemic steroids: Secondary | ICD-10-CM | POA: Diagnosis not present

## 2018-06-28 DIAGNOSIS — E663 Overweight: Secondary | ICD-10-CM | POA: Diagnosis not present

## 2018-06-28 DIAGNOSIS — M316 Other giant cell arteritis: Secondary | ICD-10-CM | POA: Diagnosis not present

## 2018-06-28 DIAGNOSIS — Z6829 Body mass index (BMI) 29.0-29.9, adult: Secondary | ICD-10-CM | POA: Diagnosis not present

## 2018-08-02 ENCOUNTER — Other Ambulatory Visit: Payer: Self-pay | Admitting: Internal Medicine

## 2018-08-03 ENCOUNTER — Other Ambulatory Visit: Payer: Self-pay | Admitting: Internal Medicine

## 2018-08-03 NOTE — Telephone Encounter (Signed)
Patient called to get the status of her medication refill request.  Patient stated that she is all out of her BP medication and would like it before the weekend.  Please advise

## 2018-08-06 NOTE — Telephone Encounter (Signed)
lisinopril-hydrochlorothiazide (PRINZIDE,ZESTORETIC) 20-12.5 MG tablet  PT has called again at 10:12 3/09, says pharmacy says denied, told her to recheck pharmacy this afternoon

## 2018-08-06 NOTE — Telephone Encounter (Signed)
Patient called and advised it's time for a physical with Dr. Jenny Reichmann, patient agrees, appointment scheduled for Wednesday, 08/22/18 at 1540. Patient is out of medication.

## 2018-08-06 NOTE — Telephone Encounter (Signed)
Requested medication (s) are due for refill today: Yes  Requested medication (s) are on the active medication list: Yes  Last refill:  07/06/17  Future visit scheduled: Yes-CPE 08/22/18  Notes to clinic:  Expired Rx, patient is out of medication.     Requested Prescriptions  Pending Prescriptions Disp Refills   lisinopril-hydrochlorothiazide (PRINZIDE,ZESTORETIC) 20-12.5 MG tablet [Pharmacy Med Name: Lisinopril-hydroCHLOROthiazide 20-12.5 MG Oral Tablet] 180 tablet 0    Sig: Take 2 tablets by mouth once daily     Cardiovascular:  ACEI + Diuretic Combos Failed - 08/06/2018 10:34 AM      Failed - Na in normal range and within 180 days    Sodium  Date Value Ref Range Status  07/06/2017 137 135 - 145 mEq/L Final         Failed - K in normal range and within 180 days    Potassium  Date Value Ref Range Status  07/06/2017 3.7 3.5 - 5.1 mEq/L Final         Failed - Cr in normal range and within 180 days    Creatinine, Ser  Date Value Ref Range Status  07/06/2017 0.97 0.40 - 1.20 mg/dL Final         Failed - Ca in normal range and within 180 days    Calcium  Date Value Ref Range Status  07/06/2017 9.5 8.4 - 10.5 mg/dL Final         Failed - Last BP in normal range    BP Readings from Last 1 Encounters:  01/26/18 (!) 144/84         Failed - Valid encounter within last 6 months    Recent Outpatient Visits          6 months ago Pain in right thigh   [facility] -Georges Mouse, MD   1 year ago Routine health maintenance   [place], James W, MD   1 year ago Temporal arteritis [facility])   [facility] -Weston Anna, Enid Baas, MD   1 year ago Generalized headaches   [place], Enid Baas, MD   1 year ago Generalized headaches   [place], Enid Baas, MD      Future Appointments            In 2 weeks Jenny Reichmann, Hunt Oris, MD [place], [place] - Patient is not pregnant

## 2018-08-22 ENCOUNTER — Other Ambulatory Visit (INDEPENDENT_AMBULATORY_CARE_PROVIDER_SITE_OTHER): Payer: Medicare Other

## 2018-08-22 ENCOUNTER — Encounter: Payer: Self-pay | Admitting: Internal Medicine

## 2018-08-22 ENCOUNTER — Other Ambulatory Visit: Payer: Self-pay

## 2018-08-22 ENCOUNTER — Ambulatory Visit (INDEPENDENT_AMBULATORY_CARE_PROVIDER_SITE_OTHER): Payer: Medicare Other | Admitting: Internal Medicine

## 2018-08-22 VITALS — BP 136/82 | HR 80 | Temp 98.3°F | Ht 62.0 in | Wt 165.0 lb

## 2018-08-22 DIAGNOSIS — D649 Anemia, unspecified: Secondary | ICD-10-CM | POA: Insufficient documentation

## 2018-08-22 DIAGNOSIS — E2839 Other primary ovarian failure: Secondary | ICD-10-CM | POA: Diagnosis not present

## 2018-08-22 DIAGNOSIS — E538 Deficiency of other specified B group vitamins: Secondary | ICD-10-CM

## 2018-08-22 DIAGNOSIS — E559 Vitamin D deficiency, unspecified: Secondary | ICD-10-CM

## 2018-08-22 DIAGNOSIS — E782 Mixed hyperlipidemia: Secondary | ICD-10-CM | POA: Diagnosis not present

## 2018-08-22 DIAGNOSIS — Z23 Encounter for immunization: Secondary | ICD-10-CM

## 2018-08-22 DIAGNOSIS — M1611 Unilateral primary osteoarthritis, right hip: Secondary | ICD-10-CM | POA: Diagnosis not present

## 2018-08-22 DIAGNOSIS — R739 Hyperglycemia, unspecified: Secondary | ICD-10-CM | POA: Diagnosis not present

## 2018-08-22 DIAGNOSIS — M316 Other giant cell arteritis: Secondary | ICD-10-CM | POA: Diagnosis not present

## 2018-08-22 DIAGNOSIS — I10 Essential (primary) hypertension: Secondary | ICD-10-CM

## 2018-08-22 DIAGNOSIS — Z1211 Encounter for screening for malignant neoplasm of colon: Secondary | ICD-10-CM

## 2018-08-22 LAB — CBC WITH DIFFERENTIAL/PLATELET
Basophils Absolute: 0.1 10*3/uL (ref 0.0–0.1)
Basophils Relative: 1.2 % (ref 0.0–3.0)
EOS PCT: 6.1 % — AB (ref 0.0–5.0)
Eosinophils Absolute: 0.5 10*3/uL (ref 0.0–0.7)
HCT: 31.2 % — ABNORMAL LOW (ref 36.0–46.0)
Hemoglobin: 10.6 g/dL — ABNORMAL LOW (ref 12.0–15.0)
Lymphocytes Relative: 22.7 % (ref 12.0–46.0)
Lymphs Abs: 1.7 10*3/uL (ref 0.7–4.0)
MCHC: 33.9 g/dL (ref 30.0–36.0)
MCV: 85.9 fl (ref 78.0–100.0)
MONO ABS: 0.7 10*3/uL (ref 0.1–1.0)
Monocytes Relative: 8.9 % (ref 3.0–12.0)
Neutro Abs: 4.7 10*3/uL (ref 1.4–7.7)
Neutrophils Relative %: 61.1 % (ref 43.0–77.0)
Platelets: 329 10*3/uL (ref 150.0–400.0)
RBC: 3.63 Mil/uL — ABNORMAL LOW (ref 3.87–5.11)
RDW: 13.7 % (ref 11.5–15.5)
WBC: 7.6 10*3/uL (ref 4.0–10.5)

## 2018-08-22 LAB — BASIC METABOLIC PANEL
BUN: 21 mg/dL (ref 6–23)
CO2: 26 mEq/L (ref 19–32)
Calcium: 10 mg/dL (ref 8.4–10.5)
Chloride: 103 mEq/L (ref 96–112)
Creatinine, Ser: 0.89 mg/dL (ref 0.40–1.20)
GFR: 62.17 mL/min (ref >60.00)
Glucose, Bld: 112 mg/dL — ABNORMAL HIGH (ref 70–99)
Potassium: 3.9 mEq/L (ref 3.5–5.1)
Sodium: 139 mEq/L (ref 135–145)

## 2018-08-22 LAB — HEPATIC FUNCTION PANEL
ALT: 13 U/L (ref 0–35)
AST: 17 U/L (ref 0–37)
Albumin: 4.4 g/dL (ref 3.5–5.2)
Alkaline Phosphatase: 69 U/L (ref 39–117)
BILIRUBIN TOTAL: 0.3 mg/dL (ref 0.2–1.2)
Bilirubin, Direct: 0.1 mg/dL (ref 0.0–0.3)
Total Protein: 7.3 g/dL (ref 6.0–8.3)

## 2018-08-22 LAB — URINALYSIS, ROUTINE W REFLEX MICROSCOPIC
Bilirubin Urine: NEGATIVE
Ketones, ur: NEGATIVE
Leukocytes,Ua: NEGATIVE
Nitrite: NEGATIVE
SPECIFIC GRAVITY, URINE: 1.015 (ref 1.000–1.030)
Total Protein, Urine: NEGATIVE
Urine Glucose: NEGATIVE
Urobilinogen, UA: 0.2 (ref 0.0–1.0)
pH: 5.5 (ref 5.0–8.0)

## 2018-08-22 LAB — VITAMIN B12: Vitamin B-12: 256 pg/mL (ref 211–911)

## 2018-08-22 LAB — IBC PANEL
Iron: 36 ug/dL — ABNORMAL LOW (ref 42–145)
Saturation Ratios: 9.1 % — ABNORMAL LOW (ref 20.0–50.0)
TRANSFERRIN: 283 mg/dL (ref 212.0–360.0)

## 2018-08-22 LAB — LIPID PANEL
Cholesterol: 210 mg/dL — ABNORMAL HIGH (ref 0–200)
HDL: 66.3 mg/dL (ref >39.00)
LDL CALC: 113 mg/dL — AB (ref 0–99)
NonHDL: 143.73
Total CHOL/HDL Ratio: 3
Triglycerides: 155 mg/dL — ABNORMAL HIGH (ref 0.0–149.0)
VLDL: 31 mg/dL (ref 0.0–40.0)

## 2018-08-22 LAB — TSH: TSH: 2.09 u[IU]/mL (ref 0.35–4.50)

## 2018-08-22 LAB — HEMOGLOBIN A1C: Hgb A1c MFr Bld: 6.4 % (ref 4.6–6.5)

## 2018-08-22 LAB — SEDIMENTATION RATE: Sed Rate: 58 mm/hr — ABNORMAL HIGH (ref 0–30)

## 2018-08-22 LAB — VITAMIN D 25 HYDROXY (VIT D DEFICIENCY, FRACTURES): VITD: 24.4 ng/mL — ABNORMAL LOW (ref 30.00–100.00)

## 2018-08-22 NOTE — Assessment & Plan Note (Signed)
stable overall by history and exam, recent data reviewed with pt, and pt to continue medical treatment as before,  to f/u any worsening symptoms or concerns, for f/u lab 

## 2018-08-22 NOTE — Assessment & Plan Note (Signed)
stable overall by history and exam, recent data reviewed with pt, and pt to continue medical treatment as before,  to f/u any worsening symptoms or concerns  

## 2018-08-22 NOTE — Assessment & Plan Note (Signed)
Declines THR for now, may reconsider later

## 2018-08-22 NOTE — Assessment & Plan Note (Signed)
Very mild, to check b12 and iron

## 2018-08-22 NOTE — Patient Instructions (Signed)
You had the Pneumovax pneumonia shot today  Please schedule the bone density test before leaving today at the scheduling desk (where you check out)  Please continue all other medications as before, and refills have been done if requested.  Please have the pharmacy call with any other refills you may need.  Please continue your efforts at being more active, low cholesterol diet, and weight control.  You are otherwise up to date with prevention measures today.  Please keep your appointments with your specialists as you may have planned  You will be contacted regarding the referral for: colonoscopy  Please remember to call for your mammogram  Please go to the LAB in the Basement (turn left off the elevator) for the tests to be done today  You will be contacted by phone if any changes need to be made immediately.  Otherwise, you will receive a letter about your results with an explanation, but please check with MyChart first.  Please remember to sign up for MyChart if you have not done so, as this will be important to you in the future with finding out test results, communicating by private email, and scheduling acute appointments online when needed.  Please return in 1 year for your yearly visit, or sooner if needed

## 2018-08-22 NOTE — Assessment & Plan Note (Signed)
[place] for sed rate, f/u rheum next month as planned

## 2018-08-22 NOTE — Progress Notes (Signed)
Subjective:    Patient ID: Alexis Price, female    DOB: 1945/07/04, 73 y.o.   MRN: [id number]  HPI  Here for wellness and f/u;  Overall doing ok;  Pt denies Chest pain, worsening SOB, DOE, wheezing, orthopnea, PND, worsening LE edema, palpitations, dizziness or syncope.  Pt denies neurological change such as new headache, facial or extremity weakness.  Pt denies polydipsia, polyuria, or low sugar symptoms. Pt states overall good compliance with treatment and medications, good tolerability, and has been trying to follow appropriate diet.  Pt denies worsening depressive symptoms, suicidal ideation or panic. No fever, night sweats, wt loss, loss of appetite, or other constitutional symptoms.  Pt states good ability with ADL's, has low fall risk, home safety reviewed and adequate, no other significant changes in hearing or vision, and only occasionally active with exercise.  Did have cortisone shot to right hip per Dr Yvonne Kendall, helped some, recommended for right hip THR but has declined for now.  May consider a second opinion with her husbands ortho who just did his replacement.  Due for pneumovax, colonscopy, DXA and mammogram.  Has been off the prednisone sinc jan 2020 for arteritis, and asking for repeat sed rate.  Denies worsening reflux, abd pain, dysphagia, n/v, bowel change or blood, and no other overt bleeding Past Medical History:  Diagnosis Date  . Essential hypertension 08/24/2007   Qualifier: Diagnosis of  By: Tiney Rouge CMA, Ellison Hughs    . HYPERLIPIDEMIA   . Hyperlipidemia 08/24/2007   Qualifier: Diagnosis of  By: Tiney Rouge CMA, Ellison Hughs   medicaation - simvastatin 20 mg  On for > 1 year with no adverse side affects   . HYPERTENSION   . Postmenopausal atrophic vaginitis 05/12/2011  . Temporal arteritis ([place]) 06/20/2017   Past Surgical History:  Procedure Laterality Date  . Emergent colectomy with creation of colosomy    . flex sigmoidoscopy  1998  . [place] of colostomy with  salpingectomy and lysis of adhesion  04/2005  . TONSILLECTOMY     age 85  . TUBAL LIGATION  1976    reports that she has never smoked. She has never used smokeless tobacco. She reports current alcohol use. She reports that she does not use drugs. family history includes Coronary artery disease in her mother; Heart disease in her mother; Hypertension in her mother. No Known Allergies Current Outpatient Medications on File Prior to Visit  Medication Sig Dispense Refill  . acetaminophen (TYLENOL) 325 MG tablet Take 325 mg by mouth every 6 (six) hours as needed for mild pain or headache.    Marland Kitchen atorvastatin (LIPITOR) 20 MG tablet TAKE 1 TABLET(20 MG) BY MOUTH DAILY 90 tablet 3  . lisinopril-hydrochlorothiazide (PRINZIDE,ZESTORETIC) 20-12.5 MG tablet Take 2 tablets by mouth once daily 180 tablet 0   No current facility-administered medications on file prior to visit.    Review of Systems Constitutional: Negative for other unusual diaphoresis, sweats, appetite or weight changes HENT: Negative for other worsening hearing loss, ear pain, facial swelling, mouth sores or neck stiffness.   Eyes: Negative for other worsening pain, redness or other visual disturbance.  Respiratory: Negative for other stridor or swelling Cardiovascular: Negative for other palpitations or other chest pain  Gastrointestinal: Negative for worsening diarrhea or loose stools, blood in stool, distention or other pain Genitourinary: Negative for hematuria, flank pain or other change in urine volume.  Musculoskeletal: Negative for myalgias or other joint swelling.  Skin: Negative for other color change, or other wound or worsening  drainage.  Neurological: Negative for other syncope or numbness. Hematological: Negative for other adenopathy or swelling Psychiatric/Behavioral: Negative for hallucinations, other worsening agitation, SI, self-injury, or new decreased concentration All other system neg per pt    Objective:    Physical Exam BP 136/82   Pulse 80   Temp 98.3 F (36.8 C) (Oral)   Ht 5\' 2"  (1.575 m)   Wt 165 lb (74.8 kg)   SpO2 96%   BMI 30.18 kg/m  VS noted,  Constitutional: Pt is oriented to person, place, and time. Appears well-developed and well-nourished, in no significant distress and comfortable Head: Normocephalic and atraumatic  Eyes: Conjunctivae and EOM are normal. Pupils are equal, round, and reactive to light Right Ear: External ear normal without discharge Left Ear: External ear normal without discharge Nose: Nose without discharge or deformity Mouth/Throat: Oropharynx is without other ulcerations and moist  Neck: Normal range of motion. Neck supple. No JVD present. No tracheal deviation present or significant neck LA or mass Cardiovascular: Normal rate, regular rhythm, normal heart sounds and intact distal pulses.   Pulmonary/Chest: WOB normal and breath sounds without rales or wheezing  Abdominal: Soft. Bowel sounds are normal. NT. No HSM  Musculoskeletal: Normal range of motion. Exhibits no edema Lymphadenopathy: Has no other cervical adenopathy.  Neurological: Pt is alert and oriented to person, place, and time. Pt has normal reflexes. No cranial nerve deficit. Motor grossly intact, Gait intact Skin: Skin is warm and dry. No rash noted or new ulcerations Psychiatric:  Has normal mood and affect. Behavior is normal without agitation No other lab findings Lab Results  Component Value Date   WBC 8.9 07/06/2017   HGB 11.8 (L) 07/06/2017   HCT 35.0 (L) 07/06/2017   PLT 259.0 07/06/2017   GLUCOSE 129 (H) 07/06/2017   CHOL 205 (H) 07/06/2017   TRIG 222.0 (H) 07/06/2017   HDL 66.30 07/06/2017   LDLDIRECT 95.0 07/06/2017   LDLCALC 237 (H) 10/13/2016   ALT 12 07/06/2017   AST 13 07/06/2017   NA 137 07/06/2017   K 3.7 07/06/2017   CL 98 07/06/2017   CREATININE 0.97 07/06/2017   BUN 19 07/06/2017   CO2 29 07/06/2017   TSH 1.73 07/06/2017   HGBA1C 6.5 07/06/2017         Assessment & Plan:

## 2018-08-23 ENCOUNTER — Other Ambulatory Visit: Payer: Self-pay | Admitting: Internal Medicine

## 2018-08-23 ENCOUNTER — Encounter: Payer: Self-pay | Admitting: Internal Medicine

## 2018-08-23 DIAGNOSIS — E559 Vitamin D deficiency, unspecified: Secondary | ICD-10-CM | POA: Insufficient documentation

## 2018-08-23 DIAGNOSIS — D509 Iron deficiency anemia, unspecified: Secondary | ICD-10-CM | POA: Insufficient documentation

## 2018-08-23 MED ORDER — POLYSACCHARIDE IRON COMPLEX 150 MG PO CAPS: 150.0000 mg | ORAL_CAPSULE | Freq: Every day | ORAL | 1 refills | Status: DC

## 2018-08-24 ENCOUNTER — Telehealth: Payer: Self-pay

## 2018-08-24 NOTE — Telephone Encounter (Signed)
Pt has been informed of results and expressed understanding.  °

## 2018-08-24 NOTE — Telephone Encounter (Signed)
-----   Message from Biagio Borg, MD sent at 08/23/2018 12:55 PM EDT ----- Left message on MyChart, pt to cont same tx except  The test results show that your current treatment is OK, except the Vitamin D level is low, as well iron and hemoglobin levels are lower as well.  We need to have you:  1) start OTC Vit D 2000 units per day 2)  Referral to Gastroenterology for further consideration of more evaluation 3) start Nu-iron 1 tab per day (I will send prescription) and consider taking a stool softner such as colace 100 mg twice per day with it. To avoid constipation.    Shirron and staff to please inform pt, I will do referral and rx

## 2018-09-18 IMAGING — DX DG FEMUR 2+V*R*
4 series · 4 of 4 positions shown · non-contrast
Comparison: None.

CLINICAL DATA: Right groin and thigh pain for 10 days. Chronic
steroid use.

EXAM:
RIGHT FEMUR 2 VIEWS

[femur ap (1 of 2)]
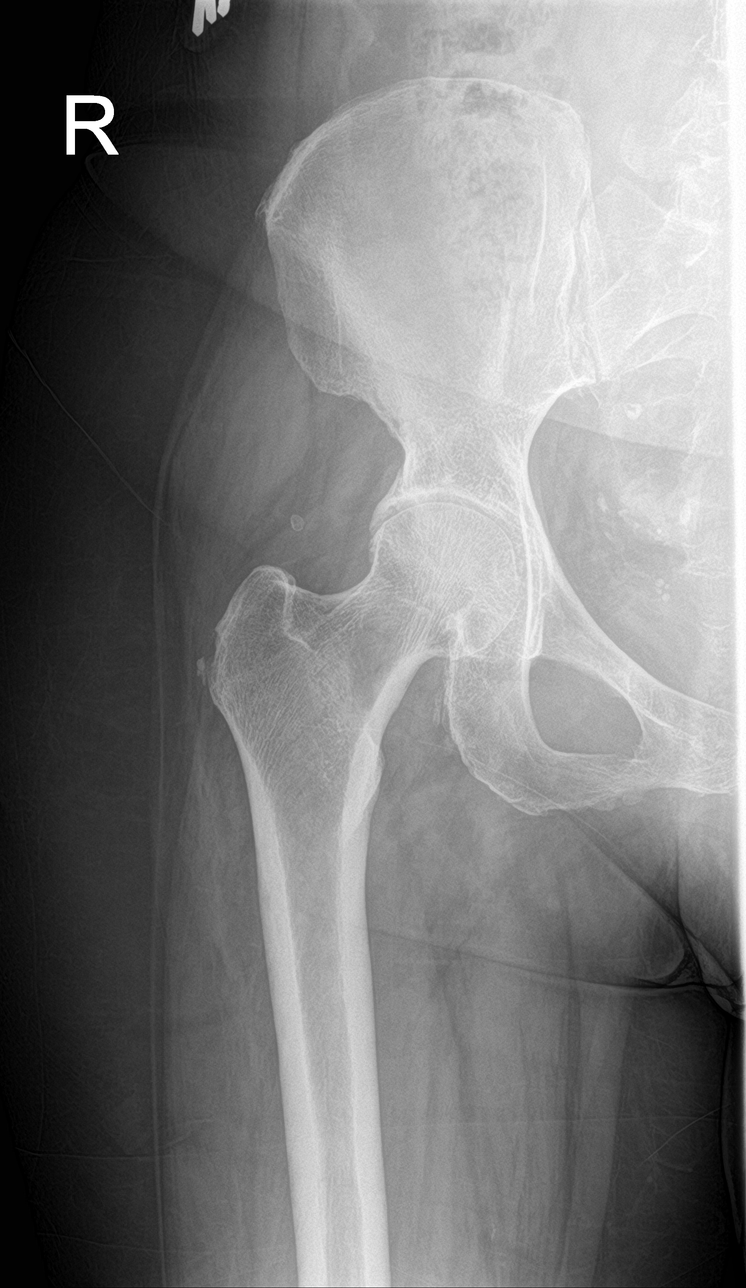

[femur ap (2 of 2)]
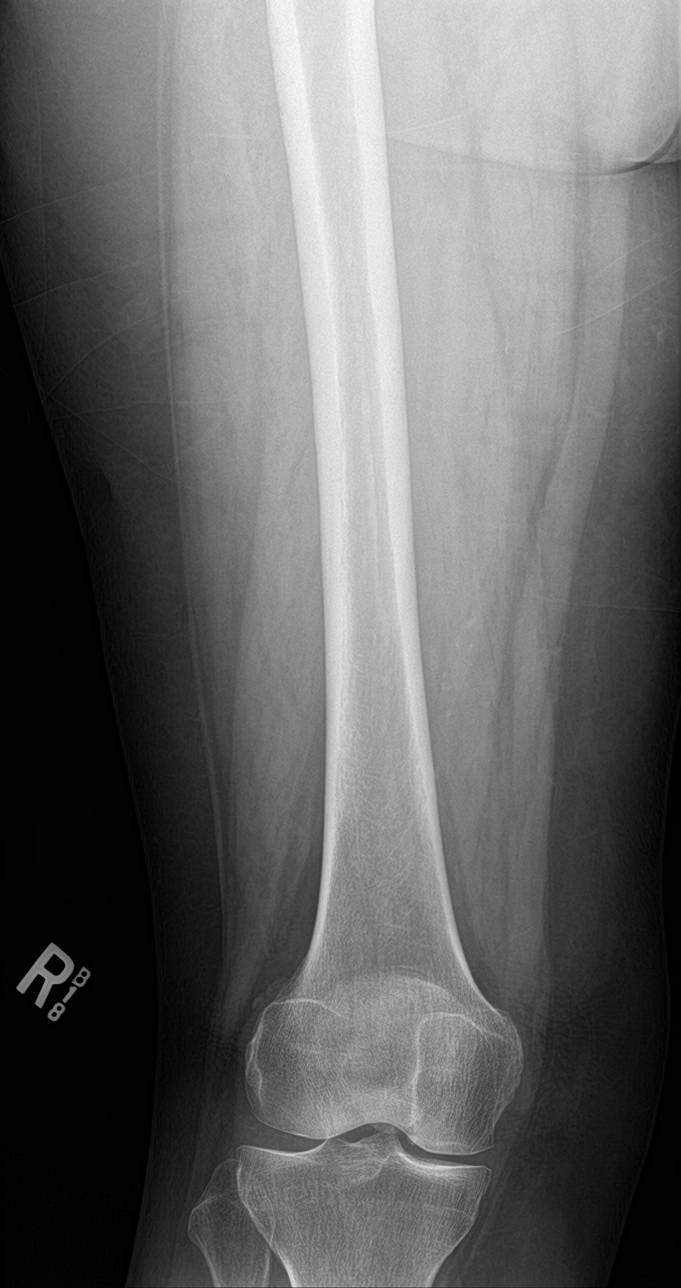

[femur lat (1 of 2)]
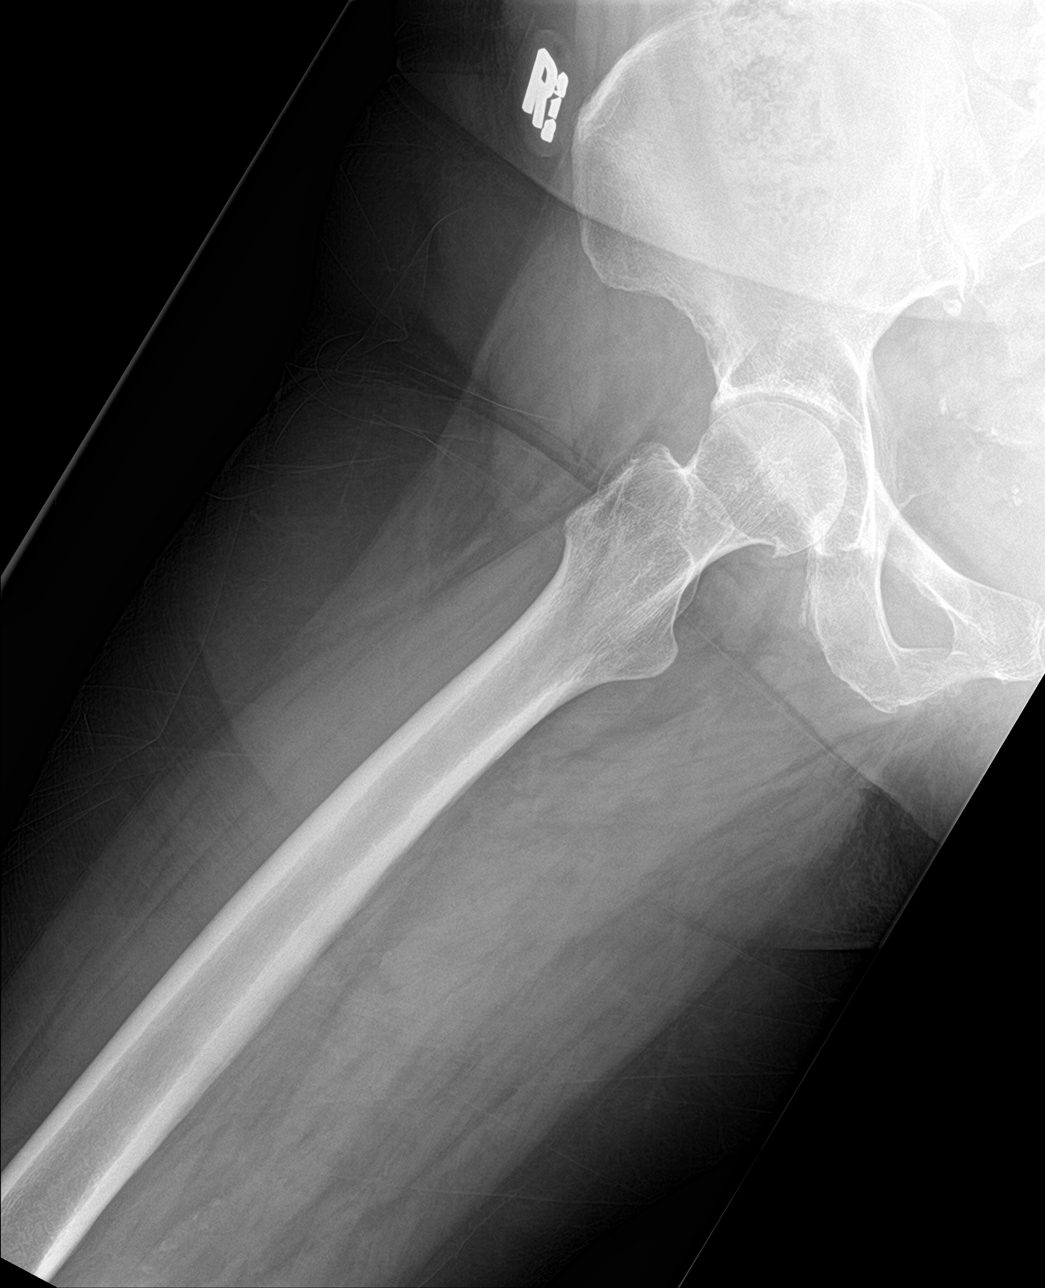

[femur lat (2 of 2)]
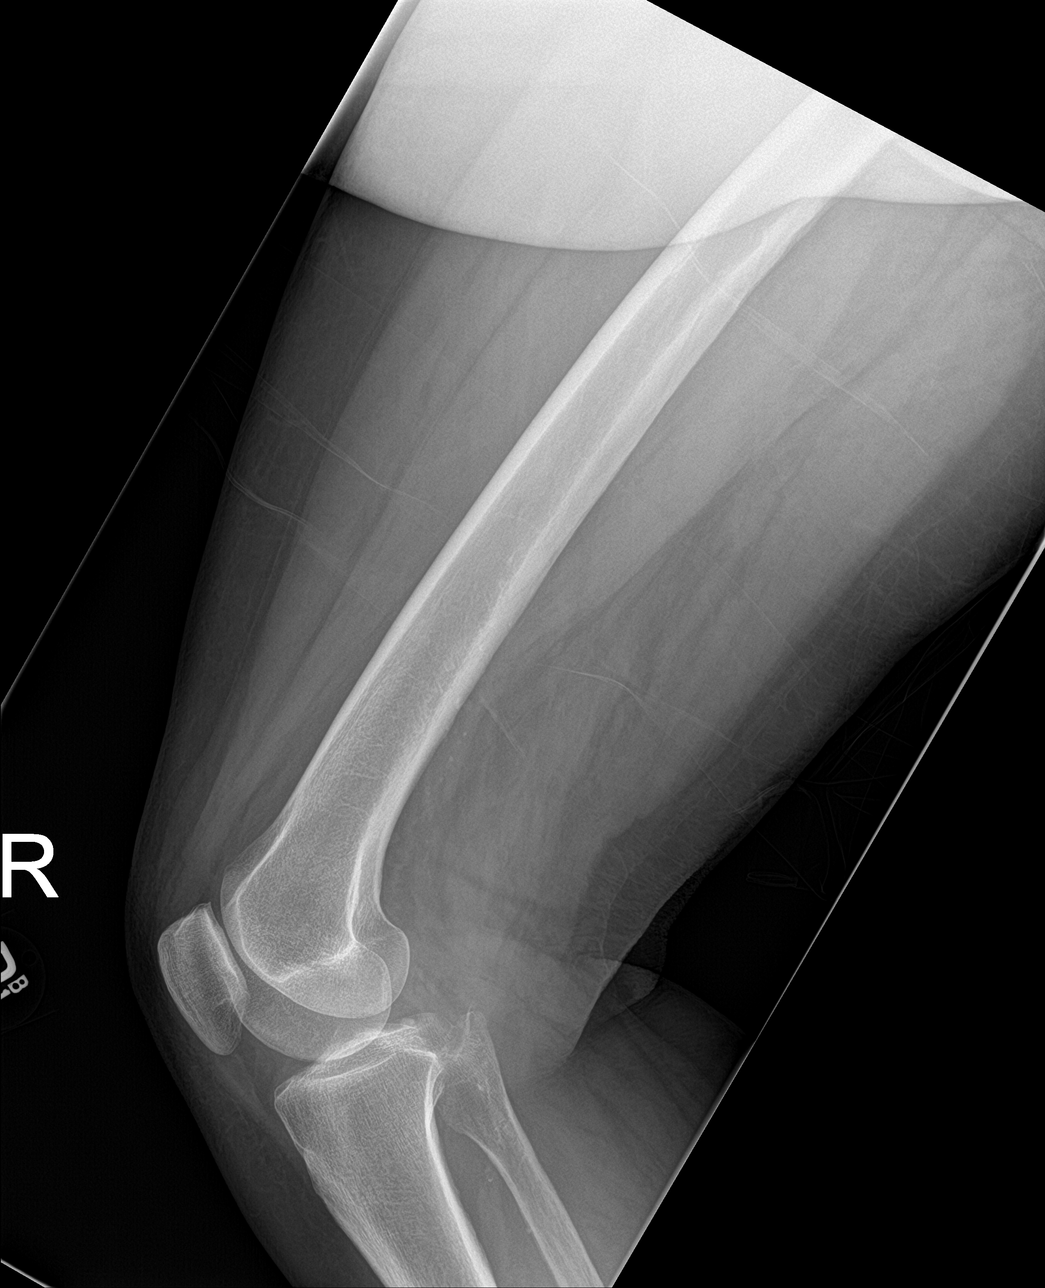

[4 of 4 positions shown; findings below may reference images not displayed]

FINDINGS: No fracture is identified. The hip and knee are located. Limited
assessment of the knee demonstrates mild medial compartment joint
space narrowing and possible subtle lateral compartment
chondrocalcinosis. Atherosclerotic vascular calcifications are
noted.
IMPRESSION: No acute osseous abnormality.

## 2018-09-18 IMAGING — DX DG HIP (WITH OR WITHOUT PELVIS) 3-4V BILAT
5 series · 5 of 5 positions shown · non-contrast
Comparison: CT abdomen and pelvis 01/25/2011

CLINICAL DATA: Right groin and thigh pain for 10 days. Chronic
steroid use.

EXAM:
DG HIP (WITH OR WITHOUT PELVIS) 3-4V BILAT

[pelvis ap]
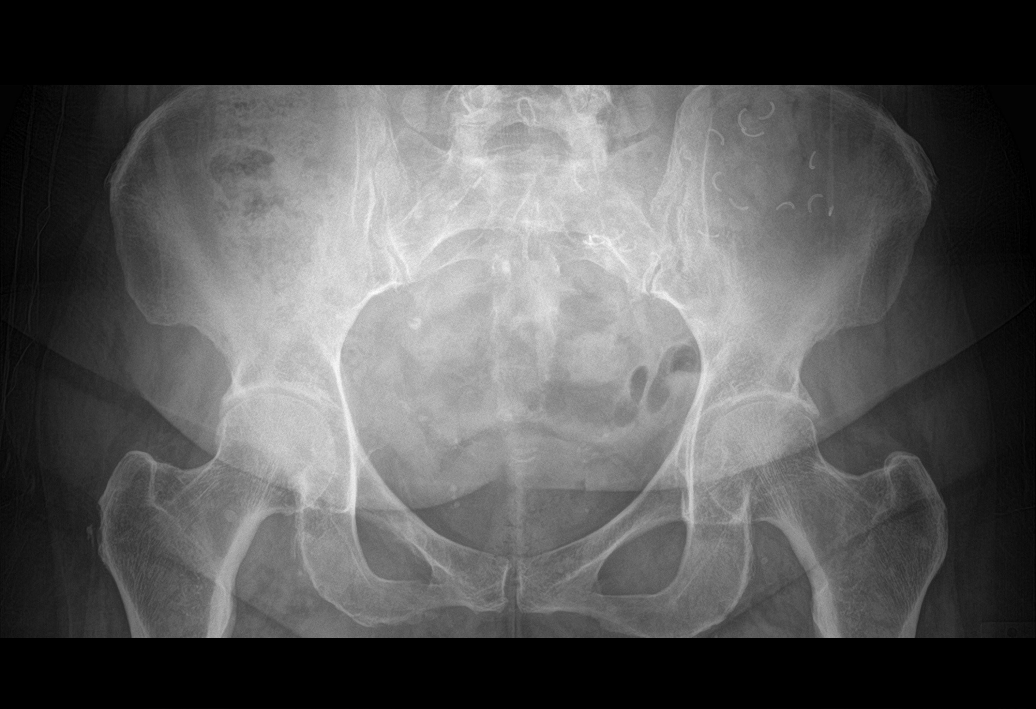

[hip ap (1 of 2)]
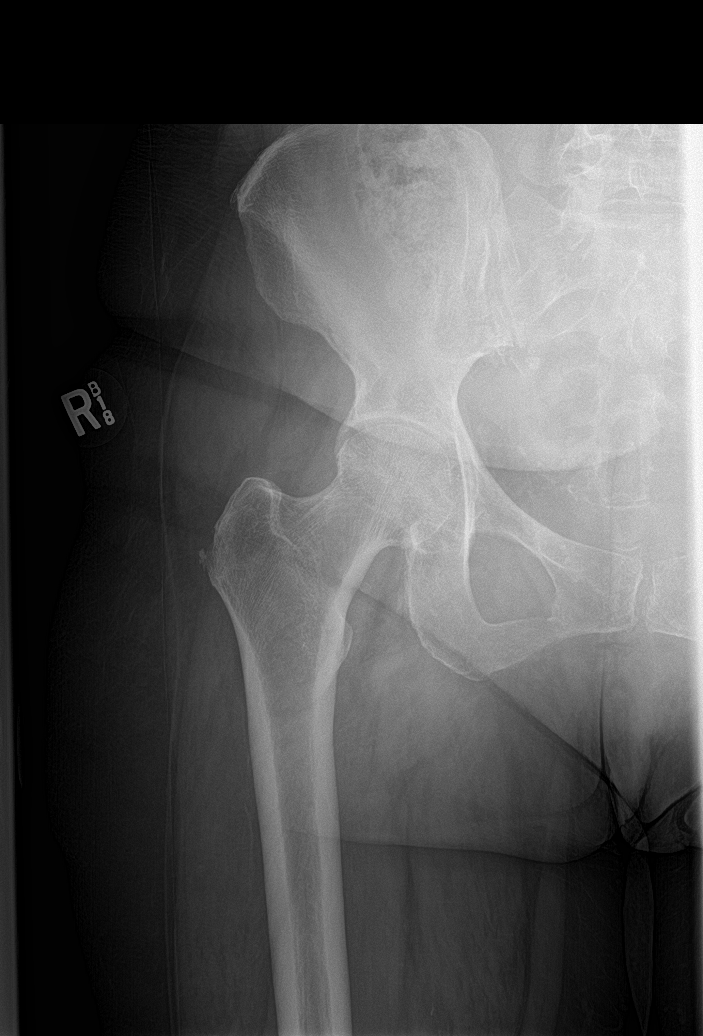

[hip lat (1 of 2)]
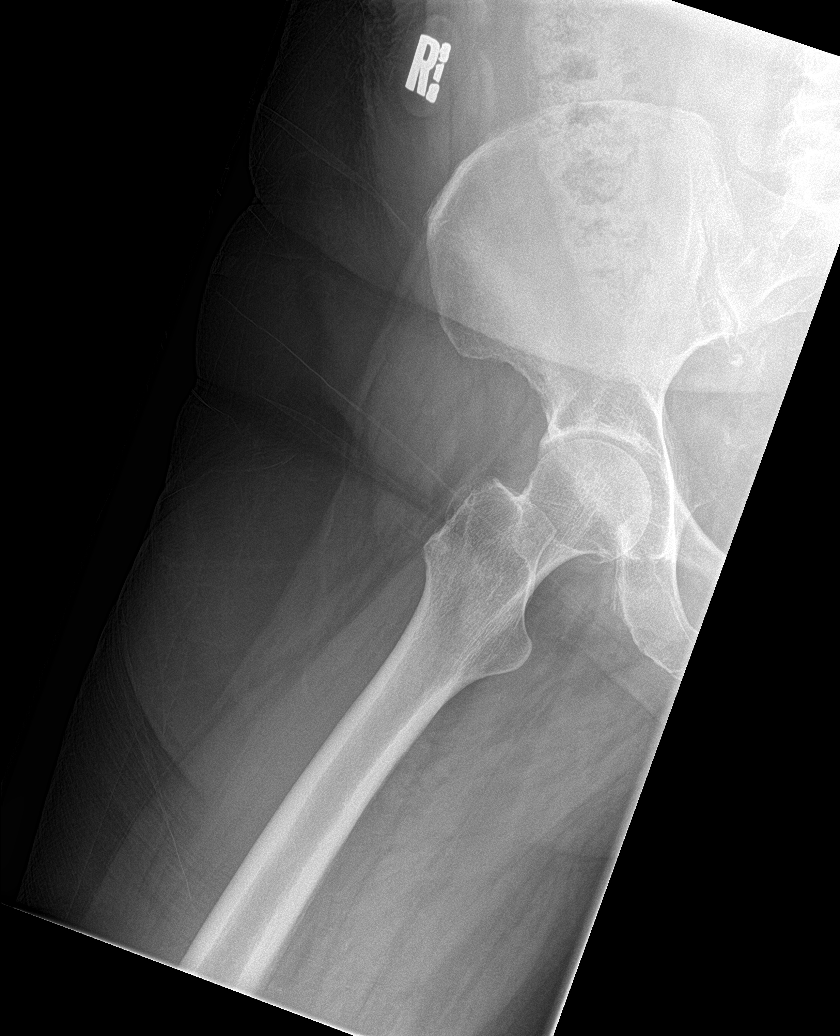

[hip ap (2 of 2)]
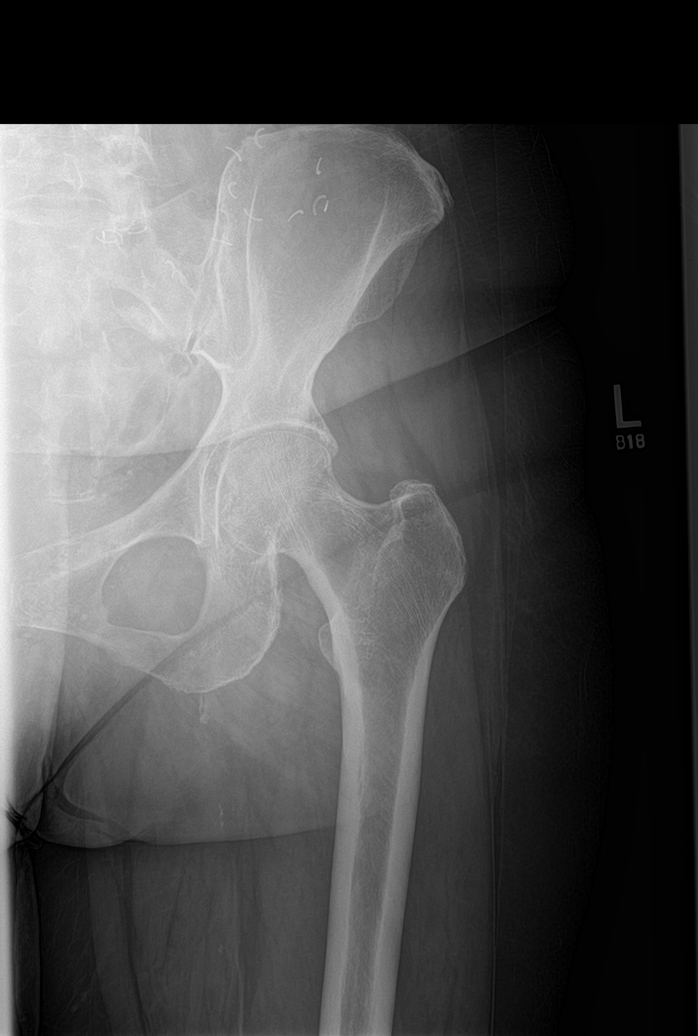

[hip lat (2 of 2)]
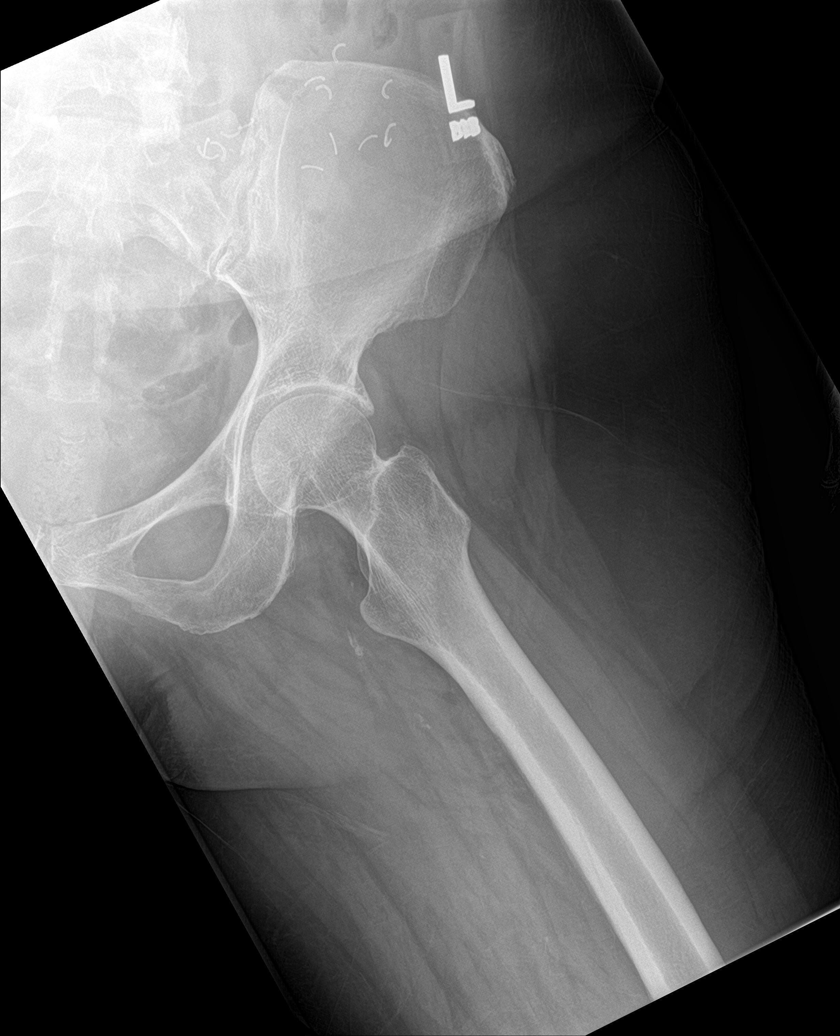

[5 of 5 positions shown; findings below may reference images not displayed]

FINDINGS: Mild hip joint space narrowing is present bilaterally. No acute
fracture or hip dislocation is identified. Sutures are noted in the
left lower abdomen. Phleboliths are present in the pelvis, and there
are atherosclerotic left femoral calcifications.
IMPRESSION: Mild degenerative changes of both hips without acute osseous
abnormality.

## 2018-10-25 DIAGNOSIS — H2513 Age-related nuclear cataract, bilateral: Secondary | ICD-10-CM | POA: Diagnosis not present

## 2018-11-06 ENCOUNTER — Other Ambulatory Visit: Payer: Self-pay | Admitting: Internal Medicine

## 2018-11-15 DIAGNOSIS — Z7952 Long term (current) use of systemic steroids: Secondary | ICD-10-CM | POA: Diagnosis not present

## 2018-11-15 DIAGNOSIS — G5603 Carpal tunnel syndrome, bilateral upper limbs: Secondary | ICD-10-CM | POA: Diagnosis not present

## 2018-11-15 DIAGNOSIS — M316 Other giant cell arteritis: Secondary | ICD-10-CM | POA: Diagnosis not present

## 2018-11-15 DIAGNOSIS — Z6829 Body mass index (BMI) 29.0-29.9, adult: Secondary | ICD-10-CM | POA: Diagnosis not present

## 2018-11-15 DIAGNOSIS — E663 Overweight: Secondary | ICD-10-CM | POA: Diagnosis not present

## 2018-11-22 DIAGNOSIS — H25011 Cortical age-related cataract, right eye: Secondary | ICD-10-CM | POA: Diagnosis not present

## 2018-11-22 DIAGNOSIS — H524 Presbyopia: Secondary | ICD-10-CM | POA: Diagnosis not present

## 2018-11-22 DIAGNOSIS — H25042 Posterior subcapsular polar age-related cataract, left eye: Secondary | ICD-10-CM | POA: Diagnosis not present

## 2018-11-22 DIAGNOSIS — H4720 Unspecified optic atrophy: Secondary | ICD-10-CM | POA: Diagnosis not present

## 2018-12-05 DIAGNOSIS — Z7952 Long term (current) use of systemic steroids: Secondary | ICD-10-CM | POA: Diagnosis not present

## 2018-12-05 DIAGNOSIS — M316 Other giant cell arteritis: Secondary | ICD-10-CM | POA: Diagnosis not present

## 2018-12-11 DIAGNOSIS — H25011 Cortical age-related cataract, right eye: Secondary | ICD-10-CM | POA: Diagnosis not present

## 2018-12-11 DIAGNOSIS — H2511 Age-related nuclear cataract, right eye: Secondary | ICD-10-CM | POA: Diagnosis not present

## 2018-12-11 DIAGNOSIS — H25811 Combined forms of age-related cataract, right eye: Secondary | ICD-10-CM | POA: Diagnosis not present

## 2019-01-15 DIAGNOSIS — H25042 Posterior subcapsular polar age-related cataract, left eye: Secondary | ICD-10-CM | POA: Diagnosis not present

## 2019-01-15 DIAGNOSIS — H25012 Cortical age-related cataract, left eye: Secondary | ICD-10-CM | POA: Diagnosis not present

## 2019-01-15 DIAGNOSIS — H25812 Combined forms of age-related cataract, left eye: Secondary | ICD-10-CM | POA: Diagnosis not present

## 2019-01-15 DIAGNOSIS — H2512 Age-related nuclear cataract, left eye: Secondary | ICD-10-CM | POA: Diagnosis not present

## 2019-02-05 ENCOUNTER — Other Ambulatory Visit: Payer: Self-pay | Admitting: Internal Medicine

## 2019-02-05 DIAGNOSIS — E782 Mixed hyperlipidemia: Secondary | ICD-10-CM

## 2019-08-23 ENCOUNTER — Other Ambulatory Visit: Payer: Self-pay

## 2019-08-23 ENCOUNTER — Ambulatory Visit (INDEPENDENT_AMBULATORY_CARE_PROVIDER_SITE_OTHER): Payer: Medicare Other | Admitting: Internal Medicine

## 2019-08-23 ENCOUNTER — Encounter: Payer: Self-pay | Admitting: Internal Medicine

## 2019-08-23 VITALS — BP 138/70 | HR 70 | Temp 98.1°F | Ht 62.0 in | Wt 159.0 lb

## 2019-08-23 DIAGNOSIS — E538 Deficiency of other specified B group vitamins: Secondary | ICD-10-CM

## 2019-08-23 DIAGNOSIS — R739 Hyperglycemia, unspecified: Secondary | ICD-10-CM

## 2019-08-23 DIAGNOSIS — D509 Iron deficiency anemia, unspecified: Secondary | ICD-10-CM

## 2019-08-23 DIAGNOSIS — R202 Paresthesia of skin: Secondary | ICD-10-CM | POA: Insufficient documentation

## 2019-08-23 DIAGNOSIS — E782 Mixed hyperlipidemia: Secondary | ICD-10-CM

## 2019-08-23 DIAGNOSIS — I10 Essential (primary) hypertension: Secondary | ICD-10-CM

## 2019-08-23 DIAGNOSIS — I739 Peripheral vascular disease, unspecified: Secondary | ICD-10-CM | POA: Insufficient documentation

## 2019-08-23 DIAGNOSIS — E559 Vitamin D deficiency, unspecified: Secondary | ICD-10-CM

## 2019-08-23 MED ORDER — POLYSACCHARIDE IRON COMPLEX 150 MG PO CAPS: 150.0000 mg | ORAL_CAPSULE | Freq: Every day | ORAL | 1 refills | Status: DC

## 2019-08-23 MED ORDER — LISINOPRIL-HYDROCHLOROTHIAZIDE 20-12.5 MG PO TABS: 2.0000 | ORAL_TABLET | Freq: Every day | ORAL | 3 refills | Status: DC

## 2019-08-23 MED ORDER — ATORVASTATIN CALCIUM 20 MG PO TABS: ORAL_TABLET | ORAL | 3 refills | Status: DC

## 2019-08-23 MED ORDER — AMITRIPTYLINE HCL 50 MG PO TABS: 50.0000 mg | ORAL_TABLET | Freq: Every day | ORAL | 1 refills | Status: DC

## 2019-08-23 NOTE — Assessment & Plan Note (Signed)
stable overall by history and exam, recent data reviewed with pt, and pt to continue medical treatment as before,  to f/u any worsening symptoms or concerns  

## 2019-08-23 NOTE — Patient Instructions (Signed)
Please take all new medication as prescribed - the elavil  Please continue all other medications as before, and refills have been done if requested.  Please have the pharmacy call with any other refills you may need.  Please continue your efforts at being more active, low cholesterol diet, and weight control.  You are otherwise up to date with prevention measures today.  Please keep your appointments with your specialists as you may have planned  You will be contacted regarding the referral for: leg circulation test  Please go to the LAB at the blood drawing area for the tests to be done  You will be contacted by phone if any changes need to be made immediately.  Otherwise, you will receive a letter about your results with an explanation, but please check with MyChart first.  Please remember to sign up for MyChart if you have not done so, as this will be important to you in the future with finding out test results, communicating by private email, and scheduling acute appointments online when needed.  Please make an Appointment to return for your 1 year visit, or sooner if needed

## 2019-08-23 NOTE — Assessment & Plan Note (Addendum)
Newly symptomatic, for arterial u/s,  to f/u any worsening symptoms or concerns  I spent 41 minutes in preparing to see the patient by review of recent labs, imaging and procedures, obtaining and reviewing separately obtained history, communicating with the patient and family or caregiver, ordering medications, tests or procedures, and documenting clinical information in the EHR including the differential Dx, treatment, and any further evaluation and other management of left leg claudication, iron deficienct, HLD, hyperglycemia, HTN, vit d def, and feet paresthesias

## 2019-08-23 NOTE — Assessment & Plan Note (Signed)
For f/u lab, cont tx

## 2019-08-23 NOTE — Progress Notes (Signed)
Subjective:    Patient ID: Alexis Price, female    DOB: 12/28/1945, 74 y.o.   MRN: [id number]  HPI  Here to f/u; overall doing ok,  Pt denies chest pain, increasing sob or doe, wheezing, orthopnea, PND, increased LE swelling, palpitations, dizziness or syncope.  Pt denies new neurological symptoms such as new headache, or facial or extremity weakness or numbness.  Pt denies polydipsia, polyuria, or low sugar episode.  Pt states overall good compliance with meds.  Also c/o recurring reproducible like pain to left calf with ambualation each time to 1/2 - 1 block, better with rest, quit smoking in the 90's  Also has tingling discomfort every single night for many months, very difficult to get to sleep.  No overt blood loss.   Past Medical History:  Diagnosis Date  . Essential hypertension 08/24/2007   Qualifier: Diagnosis of  By: Tiney Rouge CMA, Ellison Hughs    . HYPERLIPIDEMIA   . Hyperlipidemia 08/24/2007   Qualifier: Diagnosis of  By: Tiney Rouge CMA, Ellison Hughs   medicaation - simvastatin 20 mg  On for > 1 year with no adverse side affects   . HYPERTENSION   . Postmenopausal atrophic vaginitis 05/12/2011  . Temporal arteritis ([place]) 06/20/2017   Past Surgical History:  Procedure Laterality Date  . Emergent colectomy with creation of colosomy    . flex sigmoidoscopy  1998  . [place] of colostomy with salpingectomy and lysis of adhesion  04/2005  . TONSILLECTOMY     age 9  . TUBAL LIGATION  1976    reports that she has never smoked. She has never used smokeless tobacco. She reports current alcohol use. She reports that she does not use drugs. family history includes Coronary artery disease in her mother; Heart disease in her mother; Hypertension in her mother. No Known Allergies Current Outpatient Medications on File Prior to Visit  Medication Sig Dispense Refill  . acetaminophen (TYLENOL) 325 MG tablet Take 325 mg by mouth every 6 (six) hours as needed for mild pain or headache.     No current  facility-administered medications on file prior to visit.   Review of Systems All otherwise neg per pt     Objective:   Physical Exam BP 138/70   Pulse 70   Temp 98.1 F (36.7 C)   Ht 5\' 2"  (1.575 m)   Wt 159 lb (72.1 kg)   SpO2 97%   BMI 29.08 kg/m  VS noted,  Constitutional: Pt appears in NAD HENT: Head: NCAT.  Right Ear: External ear normal.  Left Ear: External ear normal.  Eyes: . Pupils are equal, round, and reactive to light. Conjunctivae and EOM are normal Nose: without d/c or deformity Neck: Neck supple. Gross normal ROM Cardiovascular: Normal rate and regular rhythm.   Pulmonary/Chest: Effort normal and breath sounds without rales or wheezing.  Abd:  Soft, NT, ND, + BS, no organomegaly Neurological: Pt is alert. At baseline orientation, motor grossly intact Skin: Skin is warm. No rashes, other new lesions, no LE edema, dorsalis pedis trace only bilat Psychiatric: Pt behavior is normal without agitation  All otherwise neg per pt Lab Results  Component Value Date   WBC 7.6 08/22/2018   HGB 10.6 (L) 08/22/2018   HCT 31.2 (L) 08/22/2018   PLT 329.0 08/22/2018   GLUCOSE 112 (H) 08/22/2018   CHOL 210 (H) 08/22/2018   TRIG 155.0 (H) 08/22/2018   HDL 66.30 08/22/2018   LDLDIRECT 95.0 07/06/2017   LDLCALC 113 (H) 08/22/2018  ALT 13 08/22/2018   AST 17 08/22/2018   NA 139 08/22/2018   K 3.9 08/22/2018   CL 103 08/22/2018   CREATININE 0.89 08/22/2018   BUN 21 08/22/2018   CO2 26 08/22/2018   TSH 2.09 08/22/2018   HGBA1C 6.4 08/22/2018      Assessment & Plan:

## 2019-08-23 NOTE — Assessment & Plan Note (Signed)
For oral replacement 

## 2019-08-23 NOTE — Assessment & Plan Note (Signed)
Or elavil qhs,  to f/u any worsening symptoms or concerns

## 2019-08-24 LAB — IRON, TOTAL/TOTAL IRON BINDING CAP
%SAT: 9 % (calc) — ABNORMAL LOW (ref 16–45)
Iron: 35 ug/dL — ABNORMAL LOW (ref 45–160)
TIBC: 386 mcg/dL (calc) (ref 250–450)

## 2019-08-25 LAB — BASIC METABOLIC PANEL
BUN: 19 mg/dL (ref 7–25)
CO2: 26 mmol/L (ref 20–32)
Calcium: 10.4 mg/dL (ref 8.6–10.4)
Chloride: 102 mmol/L (ref 98–110)
Creat: 0.89 mg/dL (ref 0.60–0.93)
Glucose, Bld: 88 mg/dL (ref 65–99)
Potassium: 4.5 mmol/L (ref 3.5–5.3)
Sodium: 139 mmol/L (ref 135–146)

## 2019-08-25 LAB — VITAMIN B12: Vitamin B-12: 363 pg/mL (ref 200–1100)

## 2019-08-25 LAB — CBC WITH DIFFERENTIAL/PLATELET
Absolute Monocytes: 508 cells/uL (ref 200–950)
Basophils Absolute: 53 cells/uL (ref 0–200)
Basophils Relative: 0.8 %
Eosinophils Absolute: 152 cells/uL (ref 15–500)
Eosinophils Relative: 2.3 %
HCT: 32.8 % — ABNORMAL LOW (ref 35.0–45.0)
Hemoglobin: 10.8 g/dL — ABNORMAL LOW (ref 11.7–15.5)
Lymphs Abs: 1571 cells/uL (ref 850–3900)
MCH: 28.4 pg (ref 27.0–33.0)
MCHC: 32.9 g/dL (ref 32.0–36.0)
MCV: 86.3 fL (ref 80.0–100.0)
MPV: 10.6 fL (ref 7.5–12.5)
Monocytes Relative: 7.7 %
Neutro Abs: 4316 cells/uL (ref 1500–7800)
Neutrophils Relative %: 65.4 %
Platelets: 339 10*3/uL (ref 140–400)
RBC: 3.8 10*6/uL (ref 3.80–5.10)
RDW: 13.5 % (ref 11.0–15.0)
Total Lymphocyte: 23.8 %
WBC: 6.6 10*3/uL (ref 3.8–10.8)

## 2019-08-25 LAB — TSH: TSH: 1.57 mIU/L (ref 0.40–4.50)

## 2019-08-25 LAB — HEPATIC FUNCTION PANEL
AG Ratio: 2.4 (calc) (ref 1.0–2.5)
ALT: 16 U/L (ref 6–29)
AST: 19 U/L (ref 10–35)
Albumin: 5 g/dL (ref 3.6–5.1)
Alkaline phosphatase (APISO): 78 U/L (ref 37–153)
Bilirubin, Direct: 0 mg/dL (ref 0.0–0.2)
Globulin: 2.1 g/dL (calc) (ref 1.9–3.7)
Indirect Bilirubin: 0.2 mg/dL (calc) (ref 0.2–1.2)
Total Bilirubin: 0.2 mg/dL (ref 0.2–1.2)
Total Protein: 7.1 g/dL (ref 6.1–8.1)

## 2019-08-25 LAB — HEMOGLOBIN A1C
Hgb A1c MFr Bld: 6 % of total Hgb — ABNORMAL HIGH (ref <5.7)
Mean Plasma Glucose: 126 (calc)
eAG (mmol/L): 7 (calc)

## 2019-08-25 LAB — URINALYSIS, ROUTINE W REFLEX MICROSCOPIC

## 2019-08-25 LAB — FERRITIN: Ferritin: 45 ng/mL (ref 16–288)

## 2019-08-25 LAB — LIPID PANEL
Cholesterol: 204 mg/dL — ABNORMAL HIGH (ref <200)
HDL: 64 mg/dL (ref >=50)
LDL Cholesterol (Calc): 110 mg/dL (calc) — ABNORMAL HIGH
Non-HDL Cholesterol (Calc): 140 mg/dL (calc) — ABNORMAL HIGH (ref <130)
Total CHOL/HDL Ratio: 3.2 (calc) (ref <5.0)
Triglycerides: 178 mg/dL — ABNORMAL HIGH (ref <150)

## 2019-08-25 LAB — VITAMIN D 25 HYDROXY (VIT D DEFICIENCY, FRACTURES): Vit D, 25-Hydroxy: 37 ng/mL (ref 30–100)

## 2019-08-26 ENCOUNTER — Other Ambulatory Visit: Payer: Self-pay | Admitting: Internal Medicine

## 2019-08-26 DIAGNOSIS — E782 Mixed hyperlipidemia: Secondary | ICD-10-CM

## 2019-08-26 MED ORDER — POLYSACCHARIDE IRON COMPLEX 150 MG PO CAPS: 150.0000 mg | ORAL_CAPSULE | Freq: Every day | ORAL | 1 refills | Status: AC

## 2019-08-26 MED ORDER — ATORVASTATIN CALCIUM 20 MG PO TABS: ORAL_TABLET | ORAL | 3 refills | Status: DC

## 2019-08-28 ENCOUNTER — Encounter: Payer: Self-pay | Admitting: Physician Assistant

## 2019-08-30 ENCOUNTER — Encounter (HOSPITAL_COMMUNITY): Payer: Medicare Other

## 2019-09-02 ENCOUNTER — Other Ambulatory Visit: Payer: Self-pay | Admitting: Internal Medicine

## 2019-09-02 ENCOUNTER — Ambulatory Visit (HOSPITAL_COMMUNITY)
Admission: RE | Admit: 2019-09-02 | Discharge: 2019-09-02 | Disposition: A | Discharge status: Home / Self Care | Payer: Medicare Other | Source: Ambulatory Visit | Attending: Internal Medicine | Admitting: Internal Medicine

## 2019-09-02 ENCOUNTER — Encounter: Payer: Self-pay | Admitting: Internal Medicine

## 2019-09-02 ENCOUNTER — Other Ambulatory Visit: Payer: Self-pay

## 2019-09-02 DIAGNOSIS — I739 Peripheral vascular disease, unspecified: Secondary | ICD-10-CM

## 2019-09-02 NOTE — Progress Notes (Signed)
ABI       has been completed. Preliminary results can be found under CV proc through chart review. Zeplin Aleshire, BS, RDMS, RVT   

## 2019-09-16 ENCOUNTER — Ambulatory Visit: Payer: Medicare Other | Admitting: Physician Assistant

## 2019-09-26 ENCOUNTER — Other Ambulatory Visit (INDEPENDENT_AMBULATORY_CARE_PROVIDER_SITE_OTHER): Payer: Medicare Other

## 2019-09-26 ENCOUNTER — Ambulatory Visit (INDEPENDENT_AMBULATORY_CARE_PROVIDER_SITE_OTHER): Payer: Medicare Other | Admitting: Physician Assistant

## 2019-09-26 ENCOUNTER — Encounter: Payer: Self-pay | Admitting: Physician Assistant

## 2019-09-26 ENCOUNTER — Other Ambulatory Visit: Payer: Self-pay

## 2019-09-26 VITALS — BP 122/60 | HR 66 | Temp 97.5°F | Ht 62.0 in | Wt 161.6 lb

## 2019-09-26 DIAGNOSIS — D509 Iron deficiency anemia, unspecified: Secondary | ICD-10-CM | POA: Diagnosis not present

## 2019-09-26 LAB — CBC WITH DIFFERENTIAL/PLATELET
Basophils Absolute: 0.1 10*3/uL (ref 0.0–0.1)
Basophils Relative: 0.9 % (ref 0.0–3.0)
Eosinophils Absolute: 0.2 10*3/uL (ref 0.0–0.7)
Eosinophils Relative: 2.8 % (ref 0.0–5.0)
HCT: 32.4 % — ABNORMAL LOW (ref 36.0–46.0)
Hemoglobin: 10.9 g/dL — ABNORMAL LOW (ref 12.0–15.0)
Lymphocytes Relative: 27.3 % (ref 12.0–46.0)
Lymphs Abs: 1.8 10*3/uL (ref 0.7–4.0)
MCHC: 33.8 g/dL (ref 30.0–36.0)
MCV: 86.3 fl (ref 78.0–100.0)
Monocytes Absolute: 0.6 10*3/uL (ref 0.1–1.0)
Monocytes Relative: 10.1 % (ref 3.0–12.0)
Neutro Abs: 3.8 10*3/uL (ref 1.4–7.7)
Neutrophils Relative %: 58.9 % (ref 43.0–77.0)
Platelets: 314 10*3/uL (ref 150.0–400.0)
RBC: 3.75 Mil/uL — ABNORMAL LOW (ref 3.87–5.11)
RDW: 14.4 % (ref 11.5–15.5)
WBC: 6.4 10*3/uL (ref 4.0–10.5)

## 2019-09-26 NOTE — Progress Notes (Signed)
Subjective:    Patient ID: Alexis Price, female    DOB: Oct 02, 1945, 74 y.o.   MRN: [id number]  HPI Alexis Price is a pleasant 74 year old white female, new to GI today referred by Dr. Cathlean Cower for iron deficiency anemia.  Patient has not had any prior GI evaluation. She has history of hypertension, temporal arteritis, claudication.  She reports that she had an episode of perforated diverticulitis about 10 years ago and required a temporary colostomy which was reversed. Patient had recent labs done which showed hemoglobin of 10.5/hematocrit of 32.8, ferritin 45/ferritin 35/TIBC 386/iron saturation of 9.  Reviewing labs 1 year ago hemoglobin was 10.6 hematocrit of 31.22 years ago 11.8/35.  She has been started on Nu-Iron. She has no current GI complaints, specifically no changes in bowel habits melena or hematochezia.  She says she generally has a bowel movement every 2 to 3 days and that has been her pattern long-term.  She denies any abdominal pain or discomfort.  No chronic issues with heartburn or indigestion no dysphagia.  She says recently she has had very occasional episode of mild dizziness has not noted any real significant fatigue. Family history is negative for GI disease as far she is aware. No regular NSAID use.   Review of Systems Pertinent positive and negative review of systems were noted in the above HPI section.  All other review of systems was otherwise negative.  Outpatient Encounter Medications as of 09/26/2019  Medication Sig  . acetaminophen (TYLENOL) 325 MG tablet Take 325 mg by mouth every 6 (six) hours as needed for mild pain or headache.  Marland Kitchen amitriptyline (ELAVIL) 50 MG tablet Take 1 tablet (50 mg total) by mouth at bedtime.  Marland Kitchen atorvastatin (LIPITOR) 20 MG tablet Take 1 tablet by mouth once daily  . iron polysaccharides (NU-IRON) 150 MG capsule Take 1 capsule (150 mg total) by mouth daily.  Marland Kitchen lisinopril-hydrochlorothiazide (ZESTORETIC) 20-12.5 MG tablet Take 2 tablets  by mouth daily.   No facility-administered encounter medications on file as of 09/26/2019.   No Known Allergies Patient Active Problem List   Diagnosis Date Noted  . Left leg claudication ([place]) 08/23/2019  . Paresthesia of both feet 08/23/2019  . Vitamin D deficiency 08/23/2018  . Iron deficiency anemia 08/23/2018  . Anemia 08/22/2018  . Degenerative joint disease of right hip 08/22/2018  . Current chronic use of systemic steroids 01/26/2018  . Hyperglycemia 07/06/2017  . Temporal arteritis ([place]) 06/20/2017  . Generalized headaches 05/16/2017  . Medicare annual wellness visit, subsequent 09/20/2016  . Neck pain 05/27/2014  . Postmenopausal atrophic vaginitis 05/12/2011  . Routine health maintenance 05/12/2011  . Hyperlipidemia 08/24/2007  . Essential hypertension 08/24/2007   Social History   Socioeconomic History  . Marital status: Married    Spouse name: Not on file  . Number of children: 2  . Years of education: 40  . Highest education level: Not on file  Occupational History  . Occupation: Glass blower/designer  Tobacco Use  . Smoking status: Never Smoker  . Smokeless tobacco: Never Used  Substance and Sexual Activity  . Alcohol use: Yes    Comment: 3-4 times per week  . Drug use: No  . Sexual activity: Yes    Partners: Male  Other Topics Concern  . 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 2 sons ' '69, '77. 4 grand-daughters. 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    Denies abuse and feels safe  at home.    Social Determinants of Health   Financial Resource Strain:   . Difficulty of Paying Living Expenses:   Food Insecurity:   . Worried About Charity fundraiser in the Last Year:   . Arboriculturist in the Last Year:   Transportation Needs:   . Film/video editor (Medical):   Marland Kitchen Lack of Transportation (Non-Medical):   Physical Activity:   . Days of Exercise per Week:   . Minutes of Exercise per Session:   Stress:   .  Feeling of Stress :   Social Connections:   . Frequency of Communication with Friends and Family:   . Frequency of Social Gatherings with Friends and Family:   . Attends Religious Services:   . Active Member of Clubs or Organizations:   . Attends Archivist Meetings:   Marland Kitchen Marital Status:   Intimate Partner Violence:   . Fear of Current or Ex-Partner:   . Emotionally Abused:   Marland Kitchen Physically Abused:   . Sexually Abused:     Alexis Price family history includes Coronary artery disease in her mother; Heart disease in her mother; Hypertension in her mother.      Objective:    Vitals:   09/26/19 0829  BP: 122/60  Pulse: 66  Temp: (!) 97.5 F (36.4 C)    Physical Exam Well-developed well-nourished older white female in no acute distress.  Height, Weight, 161 BMI 29.5  HEENT; nontraumatic normocephalic, EOMI, PE RR LA, sclera anicteric. Oropharynx; not examined today Neck; supple, no JVD Cardiovascular; regular rate and rhythm with S1-S2, no murmur rub or gallop Pulmonary; Clear bilaterally Abdomen; soft, nontender, nondistended, no palpable mass or hepatosplenomegaly, bowel sounds are active Rectal; not done today Skin; benign exam, no jaundice rash or appreciable lesions Extremities; no clubbing cyanosis or edema skin warm and dry Neuro/Psych; alert and oriented x4, grossly nonfocal mood and affect appropriate       Assessment & Plan:   #80 74 year old white female with new iron deficiency anemia, no current GI complaints.  Patient has not had any prior GI evaluation.  Rule out chronic GI blood loss secondary to colonic versus upper GI pathology.  Consider AVMs, neoplasm, chronic gastropathy.  #2 history of hypertension #3.  History of temporal arteritis #4 remote history of perforated diverticulitis requiring emergent surgery and temporary colostomy  Plan; Patient will be scheduled for colonoscopy and upper endoscopy with Dr. Scarlette Shorts.  Both procedures  were discussed in detail with the patient including indications risks and benefits and she is agreeable to proceed. Patient has been vaccinated for COVID-19. We will check follow-up CBC today She is to continue Nu-Iron 1 p.o. daily She will need follow-up iron studies in 4 months   Amy Genia Harold PA-C 09/26/2019   Cc: Biagio Borg, MD

## 2019-09-26 NOTE — Patient Instructions (Signed)
If you are age 74 or older, your body mass index should be between 23-30. Your Body mass index is 29.56 kg/m. If this is out of the aforementioned range listed, please consider follow up with your Primary Care Provider.  If you are age 7 or younger, your body mass index should be between 19-25. Your Body mass index is 29.56 kg/m. If this is out of the aformentioned range listed, please consider follow up with your Primary Care Provider.   You have been scheduled for an endoscopy and colonoscopy. Please follow the written instructions given to you at your visit today. Please pick up your prep supplies at the pharmacy within the next 1-3 days. If you use inhalers (even only as needed), please bring them with you on the day of your procedure.  Your provider has requested that you go to the basement level for lab work before leaving today. Press "B" on the elevator. The lab is located at the first door on the left as you exit the elevator.  Due to recent changes in healthcare laws, you may see the results of your imaging and laboratory studies on MyChart before your provider has had a chance to review them.  We understand that in some cases there may be results that are confusing or concerning to you. Not all laboratory results come back in the same time frame and the provider may be waiting for multiple results in order to interpret others.  Please give [place] 48 hours in order for your provider to thoroughly review all the results before contacting the office for clarification of your results.   Follow up pending the results of your procedure.

## 2019-09-26 NOTE — Progress Notes (Signed)
Assessment and plans reviewed  

## 2019-10-18 ENCOUNTER — Telehealth: Payer: Self-pay | Admitting: Physician Assistant

## 2019-10-18 NOTE — Telephone Encounter (Signed)
Pt states that she was not able to use coupon for sutab because it is only for people with Medicare. Pt is wondering if she can use rx for the prep. Pls call her.

## 2019-10-21 MED ORDER — SUTAB 1479-225-188 MG PO TABS: 1.0000 | ORAL_TABLET | ORAL | 0 refills | Status: DC

## 2019-10-21 NOTE — Telephone Encounter (Signed)
Left voicemail that I sent in a prescription for Sutab to the [facility].

## 2019-11-01 ENCOUNTER — Ambulatory Visit (AMBULATORY_SURGERY_CENTER): Payer: Medicare Other | Admitting: Internal Medicine

## 2019-11-01 ENCOUNTER — Other Ambulatory Visit: Payer: Self-pay | Admitting: Internal Medicine

## 2019-11-01 ENCOUNTER — Encounter: Payer: Self-pay | Admitting: Internal Medicine

## 2019-11-01 ENCOUNTER — Other Ambulatory Visit (INDEPENDENT_AMBULATORY_CARE_PROVIDER_SITE_OTHER): Payer: Medicare Other

## 2019-11-01 ENCOUNTER — Other Ambulatory Visit: Payer: Self-pay

## 2019-11-01 VITALS — BP 126/57 | HR 61 | Temp 96.8°F | Resp 19 | Ht 62.0 in | Wt 161.0 lb

## 2019-11-01 DIAGNOSIS — K449 Diaphragmatic hernia without obstruction or gangrene: Secondary | ICD-10-CM | POA: Diagnosis not present

## 2019-11-01 DIAGNOSIS — D509 Iron deficiency anemia, unspecified: Secondary | ICD-10-CM

## 2019-11-01 DIAGNOSIS — K31819 Angiodysplasia of stomach and duodenum without bleeding: Secondary | ICD-10-CM

## 2019-11-01 DIAGNOSIS — K2950 Unspecified chronic gastritis without bleeding: Secondary | ICD-10-CM

## 2019-11-01 DIAGNOSIS — E669 Obesity, unspecified: Secondary | ICD-10-CM | POA: Diagnosis not present

## 2019-11-01 DIAGNOSIS — I10 Essential (primary) hypertension: Secondary | ICD-10-CM | POA: Diagnosis not present

## 2019-11-01 DIAGNOSIS — K5730 Diverticulosis of large intestine without perforation or abscess without bleeding: Secondary | ICD-10-CM

## 2019-11-01 LAB — CBC WITH DIFFERENTIAL/PLATELET
Basophils Absolute: 0 10*3/uL (ref 0.0–0.1)
Basophils Relative: 0.7 % (ref 0.0–3.0)
Eosinophils Absolute: 0.1 10*3/uL (ref 0.0–0.7)
Eosinophils Relative: 2 % (ref 0.0–5.0)
HCT: 32.8 % — ABNORMAL LOW (ref 36.0–46.0)
Hemoglobin: 10.9 g/dL — ABNORMAL LOW (ref 12.0–15.0)
Lymphocytes Relative: 27.5 % (ref 12.0–46.0)
Lymphs Abs: 1.8 10*3/uL (ref 0.7–4.0)
MCHC: 33.3 g/dL (ref 30.0–36.0)
MCV: 88.5 fl (ref 78.0–100.0)
Monocytes Absolute: 0.4 10*3/uL (ref 0.1–1.0)
Monocytes Relative: 6.9 % (ref 3.0–12.0)
Neutro Abs: 4 10*3/uL (ref 1.4–7.7)
Neutrophils Relative %: 62.9 % (ref 43.0–77.0)
Platelets: 294 10*3/uL (ref 150.0–400.0)
RBC: 3.71 Mil/uL — ABNORMAL LOW (ref 3.87–5.11)
RDW: 14.4 % (ref 11.5–15.5)
WBC: 6.4 10*3/uL (ref 4.0–10.5)

## 2019-11-01 LAB — FERRITIN: Ferritin: 66.2 ng/mL (ref 10.0–291.0)

## 2019-11-01 MED ORDER — SODIUM CHLORIDE 0.9 % IV SOLN: 500.0000 mL | INTRAVENOUS | Status: DC

## 2019-11-01 NOTE — Op Note (Signed)
Alexis Price Patient Name: Alexis Price Procedure Date: 11/01/2019 2:20 PM MRN: [id number] Endoscopist: Docia Chuck. [name] , MD Age: 74 Referring MD:  Date of Birth: 05/08/1946 Gender: Female Account #: [id number] Procedure:                Colonoscopy Indications:              Iron deficiency anemia. Prior history of                            complicated diverticular disease with transient                            colostomy and subsequent reversal. No prior history                            of colonoscopy Medicines:                Monitored Anesthesia Care Procedure:                Pre-Anesthesia Assessment:                           - Prior to the procedure, a History and Physical                            was performed, and patient medications and                            allergies were reviewed. The patient's tolerance of                            previous anesthesia was also reviewed. The risks                            and benefits of the procedure and the sedation                            options and risks were discussed with the patient.                            All questions were answered, and informed consent                            was obtained. Prior Anticoagulants: The patient has                            taken Plavix (clopidogrel), last dose was 6 days                            prior to procedure. ASA Grade Assessment: III - A                            patient with severe systemic disease. After  reviewing the risks and benefits, the patient was                            deemed in satisfactory condition to undergo the                            procedure.                           After obtaining informed consent, the colonoscope                            was passed under direct vision. Throughout the                            procedure, the patient's blood pressure, pulse, and                            oxygen  saturations were monitored continuously. The                            Colonoscope was introduced through the anus and                            advanced to the the cecum, identified by                            appendiceal orifice and ileocecal valve. The                            ileocecal valve, appendiceal orifice, and rectum                            were photographed. The quality of the bowel                            preparation was good. The colonoscopy was performed                            without difficulty. The patient tolerated the                            procedure well. The bowel preparation used was                            SUPREP via split dose instruction. Scope In: 2:52:48 PM Scope Out: 3:10:08 PM Scope Withdrawal Time: 0 hours 11 minutes 29 seconds  Total Procedure Duration: 0 hours 17 minutes 20 seconds  Findings:                 It was fixed stenosis at the rectosigmoid junction.                            This would not permit the passage of the adult  colonoscope. The area was subsequently navigated                            without difficulty with the pediatric colonoscope A                            few diverticula were found in the left colon. The                            colon had iron residue throughout. No polyps,                            masses or inflammation. The previous surgical                            anastomosis was not identified.                           The exam was otherwise without abnormality on                            direct and retroflexion views. Complications:            No immediate complications. Estimated blood loss:                            None. Estimated Blood Loss:     Estimated blood loss: none. Impression:               - Diverticulosis in the left colon. Fixed area at                            the rectosigmoid junction due to prior surgery.                           - The  examination was otherwise normal on direct                            and retroflexion views.                           - No specimens collected. Recommendation:           - Repeat colonoscopy is not recommended for                            screening purposes.                           - Patient has a contact number available for                            emergencies. The signs and symptoms of potential                            delayed complications were discussed with the  patient. Return to normal activities tomorrow.                            Written discharge instructions were provided to the                            patient.                           - Resume previous diet.                           - Continue present medications.                           -EGD today. Please see report Docia Chuck. [name], MD 11/01/2019 3:16:36 PM This report has been signed electronically.

## 2019-11-01 NOTE — Progress Notes (Signed)
Report to PACU, RN, vss, BBS= Clear.  

## 2019-11-01 NOTE — Progress Notes (Signed)
Vs DT  

## 2019-11-01 NOTE — Op Note (Signed)
[place] Patient Name: Alexis Price Procedure Date: 11/01/2019 2:20 PM MRN: [id number] Endoscopist: Docia Chuck. Alexis Price , MD Age: 74 Referring MD:  Date of Birth: 1945/11/08 Gender: Female Account #: [id number] Procedure:                Upper GI endoscopy with biopsies Indications:              Iron deficiency anemia Medicines:                Monitored Anesthesia Care Procedure:                Pre-Anesthesia Assessment:                           - Prior to the procedure, a History and Physical                            was performed, and patient medications and                            allergies were reviewed. The patient's tolerance of                            previous anesthesia was also reviewed. The risks                            and benefits of the procedure and the sedation                            options and risks were discussed with the patient.                            All questions were answered, and informed consent                            was obtained. Prior Anticoagulants: The patient has                            taken no previous anticoagulant or antiplatelet                            agents. ASA Grade Assessment: II - A patient with                            mild systemic disease. After reviewing the risks                            and benefits, the patient was deemed in                            satisfactory condition to undergo the procedure.                           After obtaining informed consent, the endoscope was  passed under direct vision. Throughout the                            procedure, the patient's blood pressure, pulse, and                            oxygen saturations were monitored continuously. The                            Endoscope was introduced through the mouth, and                            advanced to the second part of duodenum. The upper                            GI endoscopy was  accomplished without difficulty.                            The patient tolerated the procedure well. Scope In: Scope Out: Findings:                 The esophagus was normal.                           It was a small hiatal hernia without erosions.                           The stomach revealed nonspecific gastritis with                            hematin. CLO biopsy taken.                           The examined duodenum revealed small AVM in the                            second portion. Nonbleeding.                           The cardia and gastric fundus were normal on                            retroflexion.                           The examined duodenum was otherwise normal.                            Biopsies were taken with a cold forceps for                            histology to rule out celiac sprue. Complications:            No immediate complications. Estimated Blood Loss:     Estimated blood loss: none. Impression:               1.  Mild gastritis status post CLO biopsy                           2. 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 AVMs likely cause for                            iron deficiency anemia                           3. Otherwise unremarkable exam. Status post                            duodenal biopsies to rule out sprue Recommendation:           - Patient has a contact number available for                            emergencies. The signs and symptoms of potential                            delayed complications were discussed with the                            patient. Return to normal activities tomorrow.                            Written discharge instructions were provided to the                            patient.                           - Resume previous diet.                           - Continue present medications.                           - Repeat CBC and ferritin today                           - Return to the care of Dr. Jenne Campus. Alexis Pastor,  MD 11/01/2019 3:29:04 PM This report has been signed electronically.

## 2019-11-01 NOTE — Patient Instructions (Signed)
Handout provided on diverticulosis, hiatal hernia and gastritis.   YOU HAD AN ENDOSCOPIC PROCEDURE TODAY AT [facility]:   Refer to the procedure report that was given to you for any specific questions about what was found during the examination.  If the procedure report does not answer your questions, please call your gastroenterologist to clarify.  If you requested that your care partner not be given the details of your procedure findings, then the procedure report has been included in a sealed envelope for you to review at your convenience later.  YOU SHOULD EXPECT: Some feelings of bloating in the abdomen. Passage of more gas than usual.  Walking can help get rid of the air that was put into your GI tract during the procedure and reduce the bloating. If you had a lower endoscopy (such as a colonoscopy or flexible sigmoidoscopy) you may notice spotting of blood in your stool or on the toilet paper. If you underwent a bowel prep for your procedure, you may not have a normal bowel movement for a few days.  Please Note:  You might notice some irritation and congestion in your nose or some drainage.  This is from the oxygen used during your procedure.  There is no need for concern and it should clear up in a day or so.  SYMPTOMS TO REPORT IMMEDIATELY:   Following lower endoscopy (colonoscopy or flexible sigmoidoscopy):  Excessive amounts of blood in the stool  Significant tenderness or worsening of abdominal pains  Swelling of the abdomen that is new, acute  Fever of 100F or higher   Following upper endoscopy (EGD)  Vomiting of blood or coffee ground material  New chest pain or pain under the shoulder blades  Painful or persistently difficult swallowing  New shortness of breath  Fever of 100F or higher  Black, tarry-looking stools  For urgent or emergent issues, a gastroenterologist can be reached at any hour by calling [phone]. Do not use MyChart messaging for  urgent concerns.    DIET:  We do recommend a small meal at first, but then you may proceed to your regular diet.  Drink plenty of fluids but you should avoid alcoholic beverages for 24 hours.  ACTIVITY:  You should plan to take it easy for the rest of today and you should NOT DRIVE or use heavy machinery until tomorrow (because of the sedation medicines used during the test).    FOLLOW UP: Our staff will call the number listed on your records 48-72 hours following your procedure to check on you and address any questions or concerns that you may have regarding the information given to you following your procedure. If we do not reach you, we will leave a message.  We will attempt to reach you two times.  During this call, we will ask if you have developed any symptoms of COVID 19. If you develop any symptoms (ie: fever, flu-like symptoms, shortness of breath, cough etc.) before then, please call [phone].  If you test positive for Covid 19 in the 2 weeks post procedure, please call and report this information to [place].    If any biopsies were taken you will be contacted by phone or by letter within the next 1-3 weeks.  Please call us at [phone] if you have not heard about the biopsies in 3 weeks.    SIGNATURES/CONFIDENTIALITY: You and/or your care partner have signed paperwork which will be entered into your electronic medical record.  These signatures  attest to the fact that that the information above on your After Visit Summary has been reviewed and is understood.  Full responsibility of the confidentiality of this discharge information lies with you and/or your care-partner.

## 2019-11-01 NOTE — Progress Notes (Signed)
Called to room to assist during endoscopic procedure.  Patient ID and intended procedure confirmed with present staff. Received instructions for my participation in the procedure from the performing physician.  

## 2019-11-04 ENCOUNTER — Other Ambulatory Visit: Payer: Self-pay

## 2019-11-04 DIAGNOSIS — D509 Iron deficiency anemia, unspecified: Secondary | ICD-10-CM

## 2019-11-04 LAB — HELICOBACTER PYLORI SCREEN-BIOPSY: UREASE: POSITIVE — AB

## 2019-11-04 MED ORDER — BIS SUBCIT-METRONID-TETRACYC 140-125-125 MG PO CAPS: 3.0000 | ORAL_CAPSULE | Freq: Three times a day (TID) | ORAL | 0 refills | Status: DC

## 2019-11-04 MED ORDER — OMEPRAZOLE 20 MG PO CPDR
20.0000 mg | DELAYED_RELEASE_CAPSULE | Freq: Every day | ORAL | 0 refills | Status: DC
Start: 2019-11-04 — End: 2020-02-12

## 2019-11-05 ENCOUNTER — Telehealth: Payer: Self-pay

## 2019-11-05 NOTE — Telephone Encounter (Signed)
Attempted to reach patient for post-procedure f/u call. No answer. Left message that we will make another attempt to reach her later today and for her to please not hesitate to call us if she has any questions/concerns regarding her care. 

## 2019-11-05 NOTE — Telephone Encounter (Signed)
2nd follow up call made.  NALM 

## 2019-11-06 ENCOUNTER — Encounter: Payer: Self-pay | Admitting: Internal Medicine

## 2019-11-06 ENCOUNTER — Telehealth: Payer: Self-pay | Admitting: Hematology and Oncology

## 2019-11-06 NOTE — Telephone Encounter (Signed)
Received a new hem referral from Dr. [name] at [facility] for [place]. Ms. Warmoth has been cld and scheduled to see Dr. Lorenso Courier on 6/11 at 9am. Pt aware to arrive 15 minutes early.

## 2019-11-07 ENCOUNTER — Telehealth: Payer: Self-pay | Admitting: Internal Medicine

## 2019-11-07 ENCOUNTER — Other Ambulatory Visit: Payer: Self-pay | Admitting: Internal Medicine

## 2019-11-07 DIAGNOSIS — E782 Mixed hyperlipidemia: Secondary | ICD-10-CM

## 2019-11-07 NOTE — Telephone Encounter (Signed)
Patient is returning your call.  

## 2019-11-07 NOTE — Telephone Encounter (Signed)
Please refill as per office routine med refill policy (all routine meds refilled for 3 mo or monthly per pt preference up to one year from last visit, then month to month grace period for 3 mo, then further med refills will have to be denied)  

## 2019-11-07 NOTE — Telephone Encounter (Signed)
See result note.  

## 2019-11-08 ENCOUNTER — Other Ambulatory Visit: Payer: Self-pay

## 2019-11-08 ENCOUNTER — Inpatient Hospital Stay: Payer: Medicare Other

## 2019-11-08 ENCOUNTER — Encounter: Payer: Self-pay | Admitting: Hematology and Oncology

## 2019-11-08 ENCOUNTER — Inpatient Hospital Stay: Payer: Medicare Other | Attending: Hematology and Oncology | Admitting: Hematology and Oncology

## 2019-11-08 VITALS — BP 138/50 | HR 66 | Temp 97.5°F | Resp 18 | Ht 62.0 in | Wt 164.4 lb

## 2019-11-08 DIAGNOSIS — Z803 Family history of malignant neoplasm of breast: Secondary | ICD-10-CM | POA: Diagnosis not present

## 2019-11-08 DIAGNOSIS — R5383 Other fatigue: Secondary | ICD-10-CM | POA: Insufficient documentation

## 2019-11-08 DIAGNOSIS — D509 Iron deficiency anemia, unspecified: Secondary | ICD-10-CM | POA: Insufficient documentation

## 2019-11-08 DIAGNOSIS — D500 Iron deficiency anemia secondary to blood loss (chronic): Secondary | ICD-10-CM

## 2019-11-08 DIAGNOSIS — D649 Anemia, unspecified: Secondary | ICD-10-CM | POA: Diagnosis not present

## 2019-11-08 DIAGNOSIS — E785 Hyperlipidemia, unspecified: Secondary | ICD-10-CM | POA: Diagnosis present

## 2019-11-08 DIAGNOSIS — M1990 Unspecified osteoarthritis, unspecified site: Secondary | ICD-10-CM | POA: Diagnosis not present

## 2019-11-08 DIAGNOSIS — I10 Essential (primary) hypertension: Secondary | ICD-10-CM | POA: Insufficient documentation

## 2019-11-08 DIAGNOSIS — K2950 Unspecified chronic gastritis without bleeding: Secondary | ICD-10-CM

## 2019-11-08 DIAGNOSIS — R42 Dizziness and giddiness: Secondary | ICD-10-CM | POA: Diagnosis not present

## 2019-11-08 LAB — RETIC PANEL
Immature Retic Fract: 13.1 % (ref 2.3–15.9)
RBC.: 3.68 MIL/uL — ABNORMAL LOW (ref 3.87–5.11)
Retic Count, Absolute: 50 10*3/uL (ref 19.0–186.0)
Retic Ct Pct: 1.4 % (ref 0.4–3.1)
Reticulocyte Hemoglobin: 34.3 pg (ref >27.9)

## 2019-11-08 LAB — CMP (CANCER CENTER ONLY)
ALT: 12 U/L (ref 0–44)
AST: 16 U/L (ref 15–41)
Albumin: 4.1 g/dL (ref 3.5–5.0)
Alkaline Phosphatase: 87 U/L (ref 38–126)
Anion gap: 11 (ref 5–15)
BUN: 25 mg/dL — ABNORMAL HIGH (ref 8–23)
CO2: 28 mmol/L (ref 22–32)
Calcium: 10 mg/dL (ref 8.9–10.3)
Chloride: 103 mmol/L (ref 98–111)
Creatinine: 1.11 mg/dL — ABNORMAL HIGH (ref 0.44–1.00)
GFR, Est AFR Am: 57 mL/min — ABNORMAL LOW (ref >60)
GFR, Estimated: 49 mL/min — ABNORMAL LOW (ref >60)
Glucose, Bld: 109 mg/dL — ABNORMAL HIGH (ref 70–99)
Potassium: 5.2 mmol/L — ABNORMAL HIGH (ref 3.5–5.1)
Sodium: 142 mmol/L (ref 135–145)
Total Bilirubin: 0.3 mg/dL (ref 0.3–1.2)
Total Protein: 7.5 g/dL (ref 6.5–8.1)

## 2019-11-08 LAB — CBC WITH DIFFERENTIAL (CANCER CENTER ONLY)
Abs Immature Granulocytes: 0.02 10*3/uL (ref 0.00–0.07)
Basophils Absolute: 0.1 10*3/uL (ref 0.0–0.1)
Basophils Relative: 1 %
Eosinophils Absolute: 0.1 10*3/uL (ref 0.0–0.5)
Eosinophils Relative: 2 %
HCT: 33.8 % — ABNORMAL LOW (ref 36.0–46.0)
Hemoglobin: 11 g/dL — ABNORMAL LOW (ref 12.0–15.0)
Immature Granulocytes: 0 %
Lymphocytes Relative: 28 %
Lymphs Abs: 1.6 10*3/uL (ref 0.7–4.0)
MCH: 29.6 pg (ref 26.0–34.0)
MCHC: 32.5 g/dL (ref 30.0–36.0)
MCV: 91.1 fL (ref 80.0–100.0)
Monocytes Absolute: 0.5 10*3/uL (ref 0.1–1.0)
Monocytes Relative: 9 %
Neutro Abs: 3.5 10*3/uL (ref 1.7–7.7)
Neutrophils Relative %: 60 %
Platelet Count: 293 10*3/uL (ref 150–400)
RBC: 3.71 MIL/uL — ABNORMAL LOW (ref 3.87–5.11)
RDW: 13.2 % (ref 11.5–15.5)
WBC Count: 5.8 10*3/uL (ref 4.0–10.5)
nRBC: 0 % (ref 0.0–0.2)

## 2019-11-08 LAB — SEDIMENTATION RATE: Sed Rate: 44 mm/hr — ABNORMAL HIGH (ref 0–22)

## 2019-11-08 LAB — IRON AND TIBC
Iron: 59 ug/dL (ref 41–142)
Saturation Ratios: 15 % — ABNORMAL LOW (ref 21–57)
TIBC: 385 ug/dL (ref 236–444)
UIBC: 326 ug/dL (ref 120–384)

## 2019-11-08 LAB — LACTATE DEHYDROGENASE: LDH: 169 U/L (ref 98–192)

## 2019-11-08 LAB — FERRITIN: Ferritin: 64 ng/mL (ref 11–307)

## 2019-11-08 NOTE — Progress Notes (Signed)
[place] [phone]   Fax:([phone] [place] NOTE  Patient Care Team: Biagio Borg, MD as PCP - General (Internal Medicine) Sharyne Peach, MD (Ophthalmology)  Hematological/Oncological History # Normocytic Anemia 1) 10/13/2016: WBC 4.5, Hgb 12.2, MCV 90.2, Plt 279 2) 06/03/2017: WBC 8.0, Hgb 10.5, MCV 90.3, Plt 357 3) 08/22/2018: WBC 7.6, Hgb 10.6, MCV 85.9, Plt 329 4) 08/23/2019: WBC 6.6, Hgb 10.8, MCV 86.3, Plt 339. Iron 35, Sat 9%, TIBC 386 5) 11/01/2019: WBC 6.4, Hgb 10.9, Plt 294. Colonscopy and EGD performed. Found to have non bleeding small bowel AVM, colonic diverticulosis, and gastritis . Testing returned positive for H. Pylori.  6) 11/08/2019: establish care with Dr. Lorenso Courier   CHIEF COMPLAINTS/PURPOSE OF CONSULTATION:  " Anemia "  HISTORY OF PRESENTING ILLNESS:  Alexis Price 74 y.o. female with medical history significant for temporal arteritis, hyperlipidemia, essential hypertension, and a recent H. pylori infection who presents for evaluation of iron deficiency anemia.  On review of the previous records Alexis Price was last noted to have a normal hemoglobin on 10/13/2016 at which time she had a hemoglobin of 12.2, MCV of 90.2 and a platelet count of 279.  On 06/13/2017 the patient was noted to have a hemoglobin 10.5 and discontinued to remain in the tens through most of 2020.  Most recently on 11/01/2019 the patient was found to have a hemoglobin 10.9, platelet of 294.  Due to concern for iron deficiency anemia she was referred to gastroenterology who performed colonoscopy and EGD on 11/01/2019.  During the evaluation she was found to have a nonbleeding small bowel AVM, colonic diverticulosis, gastritis.  Biopsy of the gastritis as well as serological testing confirmed the presence of H. pylori for which she was prescribed a treatment regimen.  She has not yet started this regimen.  Due to concern for the artificial anemia the patient was  referred to hematology for further evaluation management.  On exam today Alexis Price notes that she is here for a "mild case of anemia".  She notes that she has been told she had iron deficiency anemia for, previously noted when she was pregnant.  She notes that she has been prescribed medications for treatment of H. pylori however she has not started these medications yet and she plans to start them this weekend.  She reports that she is been taking iron pills for approximately 18 months there is appear to time where she was labs will take them due to GI symptoms.  She notes that she takes them every morning with breakfast along with her other medications for lisinopril, cholesterol, and vitamin D.  On further discussion she notes that she has been having issues with easy bruising and she believes that this is due to the prednisone she has been taking for the temporal arteritis.  She notes that she does get tired easily while doing yard work and that she is more fatigued than usual.  She has been having some episodes where she gets dizzy when standing up from sitting.  She notes that she is only able to walk about a fourth of a mile before she develops a cramp in her left leg that prevents her from going further.  In terms of her diet the patient notes that her husband cannot eat red meat or pork and therefore their diet consist mostly of chicken and fish as well as vegetables.  The patient does drink alcohol in approximately 1 glass of wine per night.  She is a never smoker and is currently still working doing office work.  Her family history is unremarkable for hematological disorders, though there is a history of breast cancer in maternal aunt.  She currently denies having any issues with fevers, chills, sweats, nausea, vomiting or diarrhea.  A full 10 point ROS is listed below.  MEDICAL HISTORY:  Past Medical History:  Diagnosis Date  . Anemia   . Arthritis   . Cancer ([place])   . Diverticulosis     . Essential hypertension 08/24/2007   Qualifier: Diagnosis of  By: Tiney Rouge CMA, Ellison Hughs    . HYPERLIPIDEMIA   . Hyperlipidemia 08/24/2007   Qualifier: Diagnosis of  By: Tiney Rouge CMA, Ellison Hughs   medicaation - simvastatin 20 mg  On for > 1 year with no adverse side affects   . HYPERTENSION   . Postmenopausal atrophic vaginitis 05/12/2011  . Temporal arteritis ([place]) 06/20/2017    SURGICAL HISTORY: Past Surgical History:  Procedure Laterality Date  . COLON SURGERY    . Emergent colectomy with creation of colosomy    . flex sigmoidoscopy  1998  . [place] of colostomy with salpingectomy and lysis of adhesion  04/2005  . TONSILLECTOMY     age 34  . TUBAL LIGATION  1976    SOCIAL HISTORY: Social History   Socioeconomic History  . Marital status: Married    Spouse name: Not on file  . Number of children: 2  . Years of education: 57  . Highest education level: Not on file  Occupational History  . Occupation: Glass blower/designer  Tobacco Use  . Smoking status: Never Smoker  . Smokeless tobacco: Never Used  Vaping Use  . Vaping Use: Never used  Substance and Sexual Activity  . Alcohol use: Yes    Comment: 3-4 times per week  . Drug use: No  . Sexual activity: Yes    Partners: Male  Other Topics Concern  . 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 2 sons ' '69, '77. 4 grand-daughters. 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    Denies abuse and feels safe at home.    Social Determinants of Health   Financial Resource Strain:   . Difficulty of Paying Living Expenses:   Food Insecurity:   . Worried About Charity fundraiser in the Last Year:   . Arboriculturist in the Last Year:   Transportation Needs:   . Film/video editor (Medical):   Marland Kitchen Lack of Transportation (Non-Medical):   Physical Activity:   . Days of Exercise per Week:   . Minutes of Exercise per Session:   Stress:   . Feeling of Stress :   Social Connections:   . Frequency  of Communication with Friends and Family:   . Frequency of Social Gatherings with Friends and Family:   . Attends Religious Services:   . Active Member of Clubs or Organizations:   . Attends Archivist Meetings:   Marland Kitchen Marital Status:   Intimate Partner Violence:   . Fear of Current or Ex-Partner:   . Emotionally Abused:   Marland Kitchen Physically Abused:   . Sexually Abused:     FAMILY HISTORY: Family History  Problem Relation Age of Onset  . Hypertension Mother   . Heart disease Mother   . Coronary artery disease Mother        valve repalcements    ALLERGIES:  has No Known Allergies.  MEDICATIONS:  Current Outpatient Medications  Medication Sig Dispense Refill  . acetaminophen (TYLENOL) 325 MG tablet Take 325 mg by mouth every 6 (six) hours as needed for mild pain or headache.    Marland Kitchen amitriptyline (ELAVIL) 50 MG tablet Take 1 tablet (50 mg total) by mouth at bedtime. 90 tablet 1  . atorvastatin (LIPITOR) 20 MG tablet Take 1 tablet by mouth once daily 90 tablet 3  . bismuth-metronidazole-tetracycline (PYLERA) 140-125-125 MG capsule Take 3 capsules by mouth 4 (four) times daily -  before meals and at bedtime. 120 capsule 0  . iron polysaccharides (NU-IRON) 150 MG capsule Take 1 capsule (150 mg total) by mouth daily. 90 capsule 1  . lisinopril-hydrochlorothiazide (ZESTORETIC) 20-12.5 MG tablet Take 2 tablets by mouth daily. 180 tablet 3  . Multiple Vitamin (MULTIVITAMIN) tablet Take 1 tablet by mouth daily. Takes Nature's Bounty Vit D 3 Daily    . omeprazole (PRILOSEC) 20 MG capsule Take 1 capsule (20 mg total) by mouth daily. 20 capsule 0   No current facility-administered medications for this visit.    REVIEW OF SYSTEMS:   Constitutional: ( - ) fevers, ( - )  chills , ( - ) night sweats Eyes: ( - ) blurriness of vision, ( - ) double vision, ( - ) watery eyes Ears, nose, mouth, throat, and face: ( - ) mucositis, ( - ) sore throat Respiratory: ( - ) cough, ( - ) dyspnea, ( - )  wheezes Cardiovascular: ( - ) palpitation, ( - ) chest discomfort, ( - ) lower extremity swelling Gastrointestinal:  ( - ) nausea, ( - ) heartburn, ( - ) change in bowel habits Skin: ( - ) abnormal skin rashes Lymphatics: ( - ) new lymphadenopathy, ( - ) easy bruising Neurological: ( - ) numbness, ( - ) tingling, ( - ) new weaknesses Behavioral/Psych: ( - ) mood change, ( - ) new changes  All other systems were reviewed with the patient and are negative.  PHYSICAL EXAMINATION: ECOG PERFORMANCE STATUS: 1 - Symptomatic but completely ambulatory  Vitals:   11/08/19 0859  BP: (!) 138/50  Pulse: 66  Resp: 18  Temp: (!) 97.5 F (36.4 C)  SpO2: 100%   Filed Weights   11/08/19 0859  Weight: 164 lb 6.4 oz (74.6 kg)    GENERAL: well appearing elderly Caucasian female in NAD  SKIN: skin color, texture, turgor are normal, no rashes or significant lesions EYES: conjunctiva are pink and non-injected, sclera clear LUNGS: clear to auscultation and percussion with normal breathing effort HEART: regular rate & rhythm and no murmurs and no lower extremity edema Musculoskeletal: no cyanosis of digits and no clubbing  PSYCH: alert & oriented x 3, fluent speech NEURO: no focal motor/sensory deficits  LABORATORY DATA:  I have reviewed the data as listed CBC Latest Ref Rng & Units 11/01/2019 09/26/2019 08/23/2019  WBC 4.0 - 10.5 K/uL 6.4 6.4 6.6  Hemoglobin 12.0 - 15.0 g/dL 10.9(L) 10.9(L) 10.8(L)  Hematocrit 36 - 46 % 32.8(L) 32.4(L) 32.8(L)  Platelets 150 - 400 K/uL 294.0 314.0 339    CMP Latest Ref Rng & Units 08/23/2019 08/22/2018 07/06/2017  Glucose 65 - 99 mg/dL 88 112(H) 129(H)  BUN 7 - 25 mg/dL '19 21 19  ' Creatinine 0.60 - 0.93 mg/dL 0.89 0.89 0.97  Sodium 135 - 146 mmol/L 139 139 137  Potassium 3.5 - 5.3 mmol/L 4.5 3.9 3.7  Chloride 98 - 110 mmol/L 102 103 98  CO2 20 - 32 mmol/L  '26 26 29  ' Calcium 8.6 - 10.4 mg/dL 10.4 10.0 9.5  Total Protein 6.1 - 8.1 g/dL 7.1 7.3 7.2  Total Bilirubin  0.2 - 1.2 mg/dL 0.2 0.3 0.5  Alkaline Phos 39 - 117 U/L - 69 59  AST 10 - 35 U/L '19 17 13  ' ALT 6 - 29 U/L '16 13 12   ' RADIOGRAPHIC STUDIES: No results found.  ASSESSMENT & PLAN Alexis Price 74 y.o. female with medical history significant for temporal arteritis, hyperlipidemia, essential hypertension, and a recent H. pylori infection who presents for evaluation of iron deficiency anemia.  After review the labs, discussion with the patient, and review of the outside records the patient's findings are most consistent with an iron deficiency anemia secondary to GI bleeding.  The possible sources of GI bleeding are numerous including the gastritis from H. pylori, the AVM in the small bowel, and the diverticulosis.  Of note the patient does not see any bright red blood in her stool.  The patient's failure to respond to iron therapy could be explained by the inflammation in the stomach preventing good absorption of the p.o. iron supplementation.  We will attempt to rule out other forms of anemia by ordering vitamin B12 and a monoclonal gammopathy work-up.  Additionally we will assess her inflammatory markers with ESR and CRP.  Her hemoglobin is currently above 10 and therefore she does not require urgent IV iron supplementation.  I would recommend that she complete her 2-week course of antibiotics for H. pylori infection and then restart the p.o. iron pills with reassessment at a later time.  In the event the patient does not respond to p.o. iron we could consider IV iron supplementation.  We will plan to see the patient back in approximately 3 months time to further assess.  #Normocytic Anemia --most likely etiology for the patient's anemia is iron deficiency, likely in the setting of H pylori gastritis, AVM, and possible diverticular bleeding.  --She may not be absorbing iron well due to the inflammation in the stomach, explaining the poor response to iron pills.  --will re-evaluate iron levels today  with ferritin, Iron panel, reticulocyte panel --will assess for other causes including vitamin b12 deficiency and monoclonal gammopathy --inflammatory markers ESR and CRP to assess for poor absorption due to chronic inflammation.  --if iron levels remain low despite H. Pylori treatment and restarting PO iron therapy can consider IV iron therapy with feraheme.  --RTC in 3 months, or sooner if dictated by symptoms or worsening anemia.   No orders of the defined types were placed in this encounter.   All questions were answered. The patient knows to call the clinic with any problems, questions or concerns.  A total of more than 60 minutes were spent on this encounter and over half of that time was spent on counseling and coordination of care as outlined above.   Ledell Peoples, MD Department of Hematology/Oncology [facility] Phone: [phone] Pager: [phone] Email: [email or web address]  11/08/2019 9:24 AM

## 2019-11-11 ENCOUNTER — Telehealth: Payer: Self-pay | Admitting: Hematology and Oncology

## 2019-11-11 LAB — PROTEIN ELECTROPHORESIS, SERUM, WITH REFLEX
A/G Ratio: 1.3 (ref 0.7–1.7)
Albumin ELP: 3.8 g/dL (ref 2.9–4.4)
Alpha-1-Globulin: 0.2 g/dL (ref 0.0–0.4)
Alpha-2-Globulin: 1 g/dL (ref 0.4–1.0)
Beta Globulin: 1.2 g/dL (ref 0.7–1.3)
Gamma Globulin: 0.6 g/dL (ref 0.4–1.8)
Globulin, Total: 3 g/dL (ref 2.2–3.9)
Total Protein ELP: 6.8 g/dL (ref 6.0–8.5)

## 2019-11-11 LAB — KAPPA/LAMBDA LIGHT CHAINS
Kappa free light chain: 13.9 mg/L (ref 3.3–19.4)
Kappa, lambda light chain ratio: 1.25 (ref 0.26–1.65)
Lambda free light chains: 11.1 mg/L (ref 5.7–26.3)

## 2019-11-11 NOTE — Telephone Encounter (Signed)
Scheduled per los. Called and left msg. Mailed printout  °

## 2019-11-12 ENCOUNTER — Encounter: Payer: Self-pay | Admitting: Vascular Surgery

## 2019-11-12 ENCOUNTER — Ambulatory Visit (INDEPENDENT_AMBULATORY_CARE_PROVIDER_SITE_OTHER): Payer: Medicare Other | Admitting: Vascular Surgery

## 2019-11-12 ENCOUNTER — Other Ambulatory Visit: Payer: Self-pay

## 2019-11-12 VITALS — BP 129/72 | HR 70 | Temp 97.7°F | Resp 14 | Ht 62.0 in | Wt 164.0 lb

## 2019-11-12 DIAGNOSIS — I739 Peripheral vascular disease, unspecified: Secondary | ICD-10-CM | POA: Diagnosis not present

## 2019-11-12 MED ORDER — CILOSTAZOL 100 MG PO TABS: 100.0000 mg | ORAL_TABLET | Freq: Two times a day (BID) | ORAL | 11 refills | Status: DC

## 2019-11-12 NOTE — Progress Notes (Signed)
Patient name: Alexis Price MRN: [id number] DOB: June 18, 1945 Sex: female  REASON FOR CONSULT: Evaluate left leg claudication  HPI: Alexis Price is a 74 y.o. female, with history of hypertension hyperlipidemia that presents for evaluation of left leg claudication.  Patient states she started having pain in her left calf approximately 1 year ago.  Ultimately she has been evaluated by her PCP Dr. Jenny Reichmann and underwent referral to vascular surgery.  She states that she typically able to walk about a quarter of a mile before she has to stop.  No pain in the right leg.  Does not sound that this is disabling at this time as she still working in a furniture office.  No rest pain or tissue loss.  No previous lower extremity interventions.  Denies any tobacco abuse.  Past Medical History:  Diagnosis Date  . Anemia   . Arthritis   . Cancer ([place])   . Diverticulosis   . Essential hypertension 08/24/2007   Qualifier: Diagnosis of  By: Tiney Rouge CMA, Ellison Hughs    . HYPERLIPIDEMIA   . Hyperlipidemia 08/24/2007   Qualifier: Diagnosis of  By: Tiney Rouge CMA, Ellison Hughs   medicaation - simvastatin 20 mg  On for > 1 year with no adverse side affects   . HYPERTENSION   . Postmenopausal atrophic vaginitis 05/12/2011  . Temporal arteritis ([place]) 06/20/2017    Past Surgical History:  Procedure Laterality Date  . COLON SURGERY    . Emergent colectomy with creation of colosomy    . flex sigmoidoscopy  1998  . [place] of colostomy with salpingectomy and lysis of adhesion  04/2005  . TONSILLECTOMY     age 26  . TUBAL LIGATION  1976    Family History  Problem Relation Age of Onset  . Hypertension Mother   . Heart disease Mother   . Coronary artery disease Mother        valve repalcements    SOCIAL HISTORY: Social History   Socioeconomic History  . Marital status: Married    Spouse name: Not on file  . Number of children: 2  . Years of education: 43  . Highest education level: Not on file    Occupational History  . Occupation: Glass blower/designer  Tobacco Use  . Smoking status: Never Smoker  . Smokeless tobacco: Never Used  Vaping Use  . Vaping Use: Never used  Substance and Sexual Activity  . Alcohol use: Yes    Comment: 3-4 times per week  . Drug use: No  . Sexual activity: Yes    Partners: Male  Other Topics Concern  . 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 2 sons ' '69, '77. 4 grand-daughters. 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    Denies abuse and feels safe at home.    Social Determinants of Health   Financial Resource Strain:   . Difficulty of Paying Living Expenses:   Food Insecurity:   . Worried About Charity fundraiser in the Last Year:   . Arboriculturist in the Last Year:   Transportation Needs:   . Film/video editor (Medical):   Marland Kitchen Lack of Transportation (Non-Medical):   Physical Activity:   . Days of Exercise per Week:   . Minutes of Exercise per Session:   Stress:   . Feeling of Stress :   Social Connections:   . Frequency of Communication with Friends and Family:   .  Frequency of Social Gatherings with Friends and Family:   . Attends Religious Services:   . Active Member of Clubs or Organizations:   . Attends Archivist Meetings:   Marland Kitchen Marital Status:   Intimate Partner Violence:   . Fear of Current or Ex-Partner:   . Emotionally Abused:   Marland Kitchen Physically Abused:   . Sexually Abused:     No Known Allergies  Current Outpatient Medications  Medication Sig Dispense Refill  . acetaminophen (TYLENOL) 325 MG tablet Take 325 mg by mouth every 6 (six) hours as needed for mild pain or headache.    Marland Kitchen amitriptyline (ELAVIL) 50 MG tablet Take 1 tablet (50 mg total) by mouth at bedtime. 90 tablet 1  . atorvastatin (LIPITOR) 20 MG tablet Take 1 tablet by mouth once daily 90 tablet 0  . bismuth-metronidazole-tetracycline (PYLERA) 140-125-125 MG capsule Take 3 capsules by mouth 4 (four) times daily  -  before meals and at bedtime. 120 capsule 0  . iron polysaccharides (NU-IRON) 150 MG capsule Take 1 capsule (150 mg total) by mouth daily. 90 capsule 1  . lisinopril-hydrochlorothiazide (ZESTORETIC) 20-12.5 MG tablet Take 2 tablets by mouth daily. 180 tablet 3  . Multiple Vitamin (MULTIVITAMIN) tablet Take 1 tablet by mouth daily. Takes Nature's Bounty Vit D 3 Daily    . omeprazole (PRILOSEC) 20 MG capsule Take 1 capsule (20 mg total) by mouth daily. 20 capsule 0   No current facility-administered medications for this visit.    REVIEW OF SYSTEMS:  [X]  denotes positive finding, [ ]  denotes negative finding Cardiac  Comments:  Chest pain or chest pressure:    Shortness of breath upon exertion:    Short of breath when lying flat:    Irregular heart rhythm:        Vascular    Pain in calf, thigh, or hip brought on by ambulation: x Left calf  Pain in feet at night that wakes you up from your sleep:     Blood clot in your veins:    Leg swelling:         Pulmonary    Oxygen at home:    Productive cough:     Wheezing:         Neurologic    Sudden weakness in arms or legs:     Sudden numbness in arms or legs:     Sudden onset of difficulty speaking or slurred speech:    Temporary loss of vision in one eye:     Problems with dizziness:         Gastrointestinal    Blood in stool:     Vomited blood:         Genitourinary    Burning when urinating:     Blood in urine:        Psychiatric    Major depression:         Hematologic    Bleeding problems:    Problems with blood clotting too easily:        Skin    Rashes or ulcers:        Constitutional    Fever or chills:      PHYSICAL EXAM: Vitals:   11/12/19 0913  BP: 129/72  Pulse: 70  Resp: 14  Temp: 97.7 F (36.5 C)  TempSrc: Temporal  SpO2: 97%  Weight: 164 lb (74.4 kg)  Height: 5\' 2"  (1.575 m)    GENERAL: The patient is a well-nourished female, in no acute  distress. The vital signs are documented  above. CARDIAC: There is a regular rate and rhythm.  VASCULAR:  Palpable bilateral femoral pulses Right popliteal palpable, left popliteal nonpalpable Right PT palpable No palpable left pedal pulses No lower extremity tissue loss PULMONARY: There is good air exchange bilaterally without wheezing or rales. ABDOMEN: Soft and non-tender with normal pitched bowel sounds.  MUSCULOSKELETAL: There are no major deformities or cyanosis. NEUROLOGIC: No focal weakness or paresthesias are detected. SKIN: There are no ulcers or rashes noted. PSYCHIATRIC: The patient has a normal affect.  DATA:   I independently reviewed her ABIs which were 0.99 on the right biphasic and 0.71 on the left biphasic  Assessment/Plan:  74 year old female presents with nonlifestyle limiting left lower extremity claudication.  Discussed that claudication is typically not considered a limb threatening situation and is not considered critical limb ischemia.  We discussed that typically try and manage claudication conservatively until it becomes lifestyle limiting.  Given that she is able to walk a quarter of a mile and is working and otherwise does not feel that this is totally disabling, I would favor medical management at this time.  I did prescribe her Pletal twice daily and discussed walking therapies at least 3 times a week.  She is already taking a statin which is appropriate.  Will defer aspirin for now given that she is being worked up for anemia and is recently underwent endoscopy.  I will have her follow-up in 6 months with ABIs.  Certainly discussed that if her claudication becomes more disabling or she develops rest pain or tissue loss in the future we would likely plan for left lower extremity arteriogram and possible intervention but hopefully she can do well with medical management for now.  She knows to call with questions or concerns.   Marty Heck, MD [facility] Office:  [phone]

## 2019-11-13 ENCOUNTER — Other Ambulatory Visit: Payer: Self-pay

## 2019-11-13 ENCOUNTER — Telehealth: Payer: Self-pay | Admitting: Internal Medicine

## 2019-11-13 DIAGNOSIS — A048 Other specified bacterial intestinal infections: Secondary | ICD-10-CM

## 2019-11-13 LAB — METHYLMALONIC ACID, SERUM: Methylmalonic Acid, Quantitative: 161 nmol/L (ref 0–378)

## 2019-11-13 MED ORDER — DOXYCYCLINE HYCLATE 100 MG PO CAPS: 100.0000 mg | ORAL_CAPSULE | Freq: Two times a day (BID) | ORAL | 0 refills | Status: DC

## 2019-11-13 MED ORDER — BISMUTH SUBSALICYLATE 262 MG PO TABS: 524.0000 mg | ORAL_TABLET | Freq: Four times a day (QID) | ORAL | 0 refills | Status: DC

## 2019-11-13 MED ORDER — METRONIDAZOLE 250 MG PO TABS: 250.0000 mg | ORAL_TABLET | Freq: Two times a day (BID) | ORAL | 0 refills | Status: DC

## 2019-11-13 MED ORDER — OMEPRAZOLE 20 MG PO CPDR: 20.0000 mg | DELAYED_RELEASE_CAPSULE | Freq: Two times a day (BID) | ORAL | 0 refills | Status: DC

## 2019-11-13 NOTE — Telephone Encounter (Signed)
Pt called and stated that Pylera is too expensive.

## 2019-11-13 NOTE — Telephone Encounter (Signed)
Left detailed message on patient's voicemail letting her know that we have sent in the components to Pyl[facility] to patient's preferred pharmacy, pt advised to call the office if she had any questions or concerns.

## 2019-11-14 ENCOUNTER — Other Ambulatory Visit: Payer: Self-pay

## 2019-11-14 ENCOUNTER — Telehealth: Payer: Self-pay

## 2019-11-14 ENCOUNTER — Other Ambulatory Visit: Payer: Self-pay | Admitting: *Deleted

## 2019-11-14 DIAGNOSIS — A048 Other specified bacterial intestinal infections: Secondary | ICD-10-CM

## 2019-11-14 DIAGNOSIS — I739 Peripheral vascular disease, unspecified: Secondary | ICD-10-CM

## 2019-11-14 DIAGNOSIS — E782 Mixed hyperlipidemia: Secondary | ICD-10-CM

## 2019-11-14 MED ORDER — OMEPRAZOLE 40 MG PO CPDR
40.0000 mg | DELAYED_RELEASE_CAPSULE | Freq: Two times a day (BID) | ORAL | 0 refills | Status: DC
Start: 2019-11-14 — End: 2019-11-14

## 2019-11-14 MED ORDER — BISMUTH 262 MG PO CHEW: 2.0000 | CHEWABLE_TABLET | Freq: Four times a day (QID) | ORAL | 0 refills | Status: DC

## 2019-11-14 MED ORDER — ATORVASTATIN CALCIUM 20 MG PO TABS: 20.0000 mg | ORAL_TABLET | Freq: Every day | ORAL | 0 refills | Status: DC

## 2019-11-14 NOTE — Telephone Encounter (Signed)
Pylera components already sent

## 2020-02-04 ENCOUNTER — Telehealth: Payer: Self-pay | Admitting: Hematology and Oncology

## 2020-02-04 NOTE — Telephone Encounter (Signed)
Called pt per 9/07 sch msg- no answer. Left message for patient to call back to reschedule appt .

## 2020-02-07 ENCOUNTER — Inpatient Hospital Stay: Payer: Medicare Other

## 2020-02-07 ENCOUNTER — Ambulatory Visit: Payer: Medicare Other | Admitting: Hematology and Oncology

## 2020-02-11 ENCOUNTER — Other Ambulatory Visit: Payer: Self-pay | Admitting: Hematology and Oncology

## 2020-02-11 DIAGNOSIS — D500 Iron deficiency anemia secondary to blood loss (chronic): Secondary | ICD-10-CM

## 2020-02-11 DIAGNOSIS — D649 Anemia, unspecified: Secondary | ICD-10-CM

## 2020-02-11 NOTE — Progress Notes (Signed)
[place] [phone]   Fax:([phone]  PROGRESS NOTE  Patient Care Team: Biagio Borg, MD as PCP - General (Internal Medicine) Sharyne Peach, MD (Ophthalmology)  Hematological/Oncological History # Normocytic Anemia 1) 10/13/2016: WBC 4.5, Hgb 12.2, MCV 90.2, Plt 279 2) 06/03/2017: WBC 8.0, Hgb 10.5, MCV 90.3, Plt 357 3) 08/22/2018: WBC 7.6, Hgb 10.6, MCV 85.9, Plt 329 4) 08/23/2019: WBC 6.6, Hgb 10.8, MCV 86.3, Plt 339. Iron 35, Sat 9%, TIBC 386 5) 11/01/2019: WBC 6.4, Hgb 10.9, Plt 294. Colonscopy and EGD performed. Found to have non bleeding small bowel AVM, colonic diverticulosis, and gastritis . Testing returned positive for H. Pylori.  6) 11/08/2019: establish care with Dr. Lorenso Courier   Interval History:  Alexis Price 74 y.o. female with medical history significant for normocytic anemia who presents for a follow up visit. The patient's last visit was on 11/08/2019 at which time she established care. In the interim since the last visit Alexis Price has been well with no emergency room visits or hospitalizations.  On discussion today Alexis Price notes that she has felt good since her last visit.  She reports that she has not been getting as tired as she was prior to starting p.o. iron therapy.  She reports a nice boost in her energy level.  This is caused her bowel movements to be black, but she has not noticed any other overt signs of bleeding, bruising, or blood in the urine.  She notes she is not had any issues with shortness of breath, chest pain or lightheadedness.  A full 10 point ROS is listed below.  MEDICAL HISTORY:  Past Medical History:  Diagnosis Date   Anemia    Arthritis    Cancer ([place])    Diverticulosis    Essential hypertension 08/24/2007   Qualifier: Diagnosis of  By: Tiney Rouge CMA, [place]    Hyperlipidemia 08/24/2007   Qualifier: Diagnosis of  By: Tiney Rouge CMA, Ellison Hughs   medicaation - simvastatin 20 mg  On for > 1  year with no adverse side affects    HYPERTENSION    Postmenopausal atrophic vaginitis 05/12/2011   Temporal arteritis ([place]) 06/20/2017    SURGICAL HISTORY: Past Surgical History:  Procedure Laterality Date   COLON SURGERY     Emergent colectomy with creation of colosomy     flex sigmoidoscopy  1998   [place] of colostomy with salpingectomy and lysis of adhesion  04/2005   TONSILLECTOMY     age 65   TUBAL LIGATION  1976    SOCIAL HISTORY: Social History   Socioeconomic History   Marital status: Married    Spouse name: Not on file   Number of children: 2   Years of education: 16   Highest education level: Not on file  Occupational History   Occupation: Glass blower/designer  Tobacco Use   Smoking status: Never Smoker   Smokeless tobacco: Never Used  Scientific laboratory technician Use: Never used  Substance and Sexual Activity   Alcohol use: Yes    Comment: 3-4 times per week   Drug use: No   Sexual activity: Yes    Partners: Male  Other Topics Concern   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 2 sons ' '69, '77. 4 grand-daughters. 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    Denies abuse and feels safe at home.    Social Determinants of Health  Financial Resource Strain:    Difficulty of Paying Living Expenses: Not on file  Food Insecurity:    Worried About Charity fundraiser in the Last Year: Not on file   [employer] of Food in the Last Year: Not on file  Transportation Needs:    Lack of Transportation (Medical): Not on file   Lack of Transportation (Non-Medical): Not on file  Physical Activity:    Days of Exercise per Week: Not on file   Minutes of Exercise per Session: Not on file  Stress:    Feeling of Stress : Not on file  Social Connections:    Frequency of Communication with Friends and Family: Not on file   Frequency of Social Gatherings with Friends and Family: Not on file   Attends Religious  Services: Not on file   Active Member of Clubs or Organizations: Not on file   Attends Archivist Meetings: Not on file   Marital Status: Not on file  Intimate Partner Violence:    Fear of Current or Ex-Partner: Not on file   Emotionally Abused: Not on file   Physically Abused: Not on file   Sexually Abused: Not on file    FAMILY HISTORY: Family History  Problem Relation Age of Onset   Hypertension Mother    Heart disease Mother    Coronary artery disease Mother        valve repalcements    ALLERGIES:  has No Known Allergies.  MEDICATIONS:  Current Outpatient Medications  Medication Sig Dispense Refill   acetaminophen (TYLENOL) 325 MG tablet Take 325 mg by mouth every 6 (six) hours as needed for mild pain or headache.     amitriptyline (ELAVIL) 50 MG tablet Take 1 tablet (50 mg total) by mouth at bedtime. 90 tablet 1   atorvastatin (LIPITOR) 20 MG tablet Take 1 tablet (20 mg total) by mouth daily. 90 tablet 0   Bismuth 262 MG CHEW Chew 2 tablets by mouth in the morning, at noon, in the evening, and at bedtime. 112 tablet 0   iron polysaccharides (NU-IRON) 150 MG capsule Take 1 capsule (150 mg total) by mouth daily. 90 capsule 1   lisinopril-hydrochlorothiazide (ZESTORETIC) 20-12.5 MG tablet Take 2 tablets by mouth daily. 180 tablet 3   Multiple Vitamin (MULTIVITAMIN) tablet Take 1 tablet by mouth daily. Takes Nature's Bounty Vit D 3 Daily     omeprazole (PRILOSEC) 20 MG capsule Take 1 capsule (20 mg total) by mouth in the morning and at bedtime for 14 days. 28 capsule 0   No current facility-administered medications for this visit.    REVIEW OF SYSTEMS:   Constitutional: ( - ) fevers, ( - )  chills , ( - ) night sweats Eyes: ( - ) blurriness of vision, ( - ) double vision, ( - ) watery eyes Ears, nose, mouth, throat, and face: ( - ) mucositis, ( - ) sore throat Respiratory: ( - ) cough, ( - ) dyspnea, ( - ) wheezes Cardiovascular: ( - )  palpitation, ( - ) chest discomfort, ( - ) lower extremity swelling Gastrointestinal:  ( - ) nausea, ( - ) heartburn, ( - ) change in bowel habits Skin: ( - ) abnormal skin rashes Lymphatics: ( - ) new lymphadenopathy, ( - ) easy bruising Neurological: ( - ) numbness, ( - ) tingling, ( - ) new weaknesses Behavioral/Psych: ( - ) mood change, ( - ) new changes  All other systems were reviewed with  the patient and are negative.  PHYSICAL EXAMINATION:  Vitals:   02/12/20 1604  BP: (!) 165/84  Pulse: 98  Resp: 18  Temp: 98.7 F (37.1 C)  SpO2: 99%   Filed Weights   02/12/20 1604  Weight: 166 lb 9.6 oz (75.6 kg)    GENERAL: well appearing elderly Caucasian female, alert, no distress and comfortable SKIN: skin color, texture, turgor are normal, no rashes or significant lesions EYES: conjunctiva are pink and non-injected, sclera clear LUNGS: clear to auscultation and percussion with normal breathing effort HEART: regular rate & rhythm and no murmurs and no lower extremity edema Musculoskeletal: no cyanosis of digits and no clubbing  PSYCH: alert & oriented x 3, fluent speech NEURO: no focal motor/sensory deficits  LABORATORY DATA:  I have reviewed the data as listed CBC Latest Ref Rng & Units 02/12/2020 11/08/2019 11/01/2019  WBC 4.0 - 10.5 K/uL 5.8 5.8 6.4  Hemoglobin 12.0 - 15.0 g/dL 10.8(L) 11.0(L) 10.9(L)  Hematocrit 36 - 46 % 32.3(L) 33.8(L) 32.8(L)  Platelets 150 - 400 K/uL 271 293 294.0    CMP Latest Ref Rng & Units 02/12/2020 11/08/2019 08/23/2019  Glucose 70 - 99 mg/dL 130(H) 109(H) 88  BUN 8 - 23 mg/dL 19 25(H) 19  Creatinine 0.44 - 1.00 mg/dL 1.12(H) 1.11(H) 0.89  Sodium 135 - 145 mmol/L 140 142 139  Potassium 3.5 - 5.1 mmol/L 4.2 5.2(H) 4.5  Chloride 98 - 111 mmol/L 103 103 102  CO2 22 - 32 mmol/L 28 28 26   Calcium 8.9 - 10.3 mg/dL 9.8 10.0 10.4  Total Protein 6.5 - 8.1 g/dL 7.2 7.5 7.1  Total Bilirubin 0.3 - 1.2 mg/dL 0.4 0.3 0.2  Alkaline Phos 38 - 126 U/L 89 87 -    AST 15 - 41 U/L 21 16 19   ALT 0 - 44 U/L 14 12 16     No results found for: MPROTEIN Lab Results  Component Value Date   KPAFRELGTCHN 13.9 11/08/2019   LAMBDASER 11.1 11/08/2019   KAPLAMBRATIO 1.25 11/08/2019     RADIOGRAPHIC STUDIES: No results found.  ASSESSMENT & PLAN Alexis Price 74 y.o. female with medical history significant for normocytic anemia who presents for a follow up visit.  On exam today Alexis Price notes that her energy level has improved, however her labs do not show much improvement in the way of her iron stores.  As such I recommend we proceed with IV Feraheme 510 mg q. 7 days x 2 doses in order to help bolster her iron stores and improve her normocytic anemia.  The patient voiced her understanding of this plan moving forward and was agreeable.  She has had no overt signs or symptoms of bleeding at this time and the etiology is most likely either poor iron absorption or continued leaking from her AVM.  I would recommend that she continue her p.o. iron therapy at this time and after she has received the IV iron return to clinic in 6 weeks in order to do sure her levels have been bolstered.  # Normocytic Anemia --recommend proceeding with IV feraheme 510mg  q 7days x 2 doses. PO iron therapy has not increased her Hgb and her iron stores have elevated minimally. --continue ferrous sulfate 325mg  PO daily with a source of vitamin C --patient does note energy improvement, but Hgb and iron levels are stagnant.  --patient had endoscopy earlier this year which revealed non bleeding small bowel AVM, colonic diverticulosis, and gastritis . He also tested H. Pylori positive.  --  f/u in clinic 6 weeks after last dose of IV iron.   No orders of the defined types were placed in this encounter.   All questions were answered. The patient knows to call the clinic with any problems, questions or concerns.  A total of more than 30 minutes were spent on this encounter and over half  of that time was spent on counseling and coordination of care as outlined above.   Ledell Peoples, MD Department of Hematology/Oncology [place] [facility] Phone: [phone] Pager: [phone] Email: [email or web address]  02/16/2020 2:25 PM

## 2020-02-12 ENCOUNTER — Encounter: Payer: Self-pay | Admitting: Hematology and Oncology

## 2020-02-12 ENCOUNTER — Inpatient Hospital Stay: Payer: Medicare Other | Attending: Hematology and Oncology

## 2020-02-12 ENCOUNTER — Inpatient Hospital Stay (HOSPITAL_BASED_OUTPATIENT_CLINIC_OR_DEPARTMENT_OTHER): Payer: Medicare Other | Admitting: Hematology and Oncology

## 2020-02-12 ENCOUNTER — Other Ambulatory Visit: Payer: Self-pay

## 2020-02-12 VITALS — BP 165/84 | HR 98 | Temp 98.7°F | Resp 18 | Ht 62.0 in | Wt 166.6 lb

## 2020-02-12 DIAGNOSIS — D500 Iron deficiency anemia secondary to blood loss (chronic): Secondary | ICD-10-CM | POA: Diagnosis not present

## 2020-02-12 DIAGNOSIS — D649 Anemia, unspecified: Secondary | ICD-10-CM

## 2020-02-12 DIAGNOSIS — I10 Essential (primary) hypertension: Secondary | ICD-10-CM | POA: Insufficient documentation

## 2020-02-12 DIAGNOSIS — E785 Hyperlipidemia, unspecified: Secondary | ICD-10-CM | POA: Diagnosis not present

## 2020-02-12 DIAGNOSIS — K2950 Unspecified chronic gastritis without bleeding: Secondary | ICD-10-CM

## 2020-02-12 DIAGNOSIS — D509 Iron deficiency anemia, unspecified: Secondary | ICD-10-CM | POA: Insufficient documentation

## 2020-02-12 DIAGNOSIS — N952 Postmenopausal atrophic vaginitis: Secondary | ICD-10-CM | POA: Diagnosis not present

## 2020-02-12 DIAGNOSIS — Z803 Family history of malignant neoplasm of breast: Secondary | ICD-10-CM | POA: Diagnosis not present

## 2020-02-12 DIAGNOSIS — Z8679 Personal history of other diseases of the circulatory system: Secondary | ICD-10-CM | POA: Insufficient documentation

## 2020-02-12 DIAGNOSIS — Z23 Encounter for immunization: Secondary | ICD-10-CM | POA: Insufficient documentation

## 2020-02-12 LAB — CBC WITH DIFFERENTIAL (CANCER CENTER ONLY)
Abs Immature Granulocytes: 0.02 10*3/uL (ref 0.00–0.07)
Basophils Absolute: 0 10*3/uL (ref 0.0–0.1)
Basophils Relative: 1 %
Eosinophils Absolute: 0.1 10*3/uL (ref 0.0–0.5)
Eosinophils Relative: 2 %
HCT: 32.3 % — ABNORMAL LOW (ref 36.0–46.0)
Hemoglobin: 10.8 g/dL — ABNORMAL LOW (ref 12.0–15.0)
Immature Granulocytes: 0 %
Lymphocytes Relative: 37 %
Lymphs Abs: 2.1 10*3/uL (ref 0.7–4.0)
MCH: 30.1 pg (ref 26.0–34.0)
MCHC: 33.4 g/dL (ref 30.0–36.0)
MCV: 90 fL (ref 80.0–100.0)
Monocytes Absolute: 0.5 10*3/uL (ref 0.1–1.0)
Monocytes Relative: 8 %
Neutro Abs: 3.1 10*3/uL (ref 1.7–7.7)
Neutrophils Relative %: 52 %
Platelet Count: 271 10*3/uL (ref 150–400)
RBC: 3.59 MIL/uL — ABNORMAL LOW (ref 3.87–5.11)
RDW: 13 % (ref 11.5–15.5)
WBC Count: 5.8 10*3/uL (ref 4.0–10.5)
nRBC: 0 % (ref 0.0–0.2)

## 2020-02-12 LAB — CMP (CANCER CENTER ONLY)
ALT: 14 U/L (ref 0–44)
AST: 21 U/L (ref 15–41)
Albumin: 4.1 g/dL (ref 3.5–5.0)
Alkaline Phosphatase: 89 U/L (ref 38–126)
Anion gap: 9 (ref 5–15)
BUN: 19 mg/dL (ref 8–23)
CO2: 28 mmol/L (ref 22–32)
Calcium: 9.8 mg/dL (ref 8.9–10.3)
Chloride: 103 mmol/L (ref 98–111)
Creatinine: 1.12 mg/dL — ABNORMAL HIGH (ref 0.44–1.00)
GFR, Est AFR Am: 56 mL/min — ABNORMAL LOW (ref >60)
GFR, Estimated: 48 mL/min — ABNORMAL LOW (ref >60)
Glucose, Bld: 130 mg/dL — ABNORMAL HIGH (ref 70–99)
Potassium: 4.2 mmol/L (ref 3.5–5.1)
Sodium: 140 mmol/L (ref 135–145)
Total Bilirubin: 0.4 mg/dL (ref 0.3–1.2)
Total Protein: 7.2 g/dL (ref 6.5–8.1)

## 2020-02-12 LAB — RETIC PANEL
Immature Retic Fract: 10.3 % (ref 2.3–15.9)
RBC.: 3.62 MIL/uL — ABNORMAL LOW (ref 3.87–5.11)
Retic Count, Absolute: 60.5 10*3/uL (ref 19.0–186.0)
Retic Ct Pct: 1.7 % (ref 0.4–3.1)
Reticulocyte Hemoglobin: 35.6 pg (ref >27.9)

## 2020-02-13 ENCOUNTER — Telehealth: Payer: Self-pay | Admitting: Hematology and Oncology

## 2020-02-13 LAB — FERRITIN: Ferritin: 73 ng/mL (ref 11–307)

## 2020-02-13 LAB — IRON AND TIBC
Iron: 68 ug/dL (ref 41–142)
Saturation Ratios: 18 % — ABNORMAL LOW (ref 21–57)
TIBC: 371 ug/dL (ref 236–444)
UIBC: 303 ug/dL (ref 120–384)

## 2020-02-13 NOTE — Telephone Encounter (Signed)
Scheduled per los. Called and left msg. Mailed printout  °

## 2020-02-14 ENCOUNTER — Encounter: Payer: Self-pay | Admitting: Hematology and Oncology

## 2020-02-16 ENCOUNTER — Encounter: Payer: Self-pay | Admitting: Hematology and Oncology

## 2020-02-20 ENCOUNTER — Inpatient Hospital Stay: Payer: Medicare Other

## 2020-02-20 ENCOUNTER — Other Ambulatory Visit: Payer: Self-pay

## 2020-02-20 VITALS — BP 149/60 | HR 62 | Temp 98.2°F | Resp 18

## 2020-02-20 DIAGNOSIS — Z803 Family history of malignant neoplasm of breast: Secondary | ICD-10-CM | POA: Diagnosis not present

## 2020-02-20 DIAGNOSIS — Z0000 Encounter for general adult medical examination without abnormal findings: Secondary | ICD-10-CM

## 2020-02-20 DIAGNOSIS — Z23 Encounter for immunization: Secondary | ICD-10-CM | POA: Diagnosis not present

## 2020-02-20 DIAGNOSIS — N952 Postmenopausal atrophic vaginitis: Secondary | ICD-10-CM | POA: Diagnosis not present

## 2020-02-20 DIAGNOSIS — E785 Hyperlipidemia, unspecified: Secondary | ICD-10-CM | POA: Diagnosis not present

## 2020-02-20 DIAGNOSIS — I10 Essential (primary) hypertension: Secondary | ICD-10-CM | POA: Diagnosis not present

## 2020-02-20 DIAGNOSIS — D500 Iron deficiency anemia secondary to blood loss (chronic): Secondary | ICD-10-CM

## 2020-02-20 DIAGNOSIS — D509 Iron deficiency anemia, unspecified: Secondary | ICD-10-CM | POA: Diagnosis not present

## 2020-02-20 MED ORDER — INFLUENZA VAC A&B SA ADJ QUAD 0.5 ML IM PRSY
PREFILLED_SYRINGE | INTRAMUSCULAR | Status: AC
  Filled 2020-02-20: qty 0.5

## 2020-02-20 MED ORDER — INFLUENZA VAC A&B SA ADJ QUAD 0.5 ML IM PRSY
0.5000 mL | PREFILLED_SYRINGE | Freq: Once | INTRAMUSCULAR | Status: AC
  Administered 2020-02-20: 0.5 mL via INTRAMUSCULAR

## 2020-02-20 MED ORDER — SODIUM CHLORIDE 0.9 % IV SOLN
510.0000 mg | Freq: Once | INTRAVENOUS | Status: AC
  Administered 2020-02-20: 510 mg via INTRAVENOUS
  Filled 2020-02-20: qty 510

## 2020-02-20 MED ORDER — SODIUM CHLORIDE 0.9 % IV SOLN
Freq: Once | INTRAVENOUS | Status: AC
  Filled 2020-02-20: qty 250

## 2020-02-20 NOTE — Patient Instructions (Signed)

## 2020-02-27 ENCOUNTER — Inpatient Hospital Stay: Payer: Medicare Other

## 2020-02-27 ENCOUNTER — Other Ambulatory Visit: Payer: Self-pay

## 2020-02-27 VITALS — BP 188/78 | HR 89 | Temp 98.7°F | Resp 16

## 2020-02-27 DIAGNOSIS — D509 Iron deficiency anemia, unspecified: Secondary | ICD-10-CM | POA: Diagnosis not present

## 2020-02-27 DIAGNOSIS — D500 Iron deficiency anemia secondary to blood loss (chronic): Secondary | ICD-10-CM

## 2020-02-27 DIAGNOSIS — Z23 Encounter for immunization: Secondary | ICD-10-CM | POA: Diagnosis not present

## 2020-02-27 DIAGNOSIS — I10 Essential (primary) hypertension: Secondary | ICD-10-CM | POA: Diagnosis not present

## 2020-02-27 DIAGNOSIS — E785 Hyperlipidemia, unspecified: Secondary | ICD-10-CM | POA: Diagnosis not present

## 2020-02-27 DIAGNOSIS — Z803 Family history of malignant neoplasm of breast: Secondary | ICD-10-CM | POA: Diagnosis not present

## 2020-02-27 DIAGNOSIS — N952 Postmenopausal atrophic vaginitis: Secondary | ICD-10-CM | POA: Diagnosis not present

## 2020-02-27 MED ORDER — SODIUM CHLORIDE 0.9 % IV SOLN
Freq: Once | INTRAVENOUS | Status: AC
  Filled 2020-02-27: qty 250

## 2020-02-27 MED ORDER — SODIUM CHLORIDE 0.9 % IV SOLN
510.0000 mg | Freq: Once | INTRAVENOUS | Status: AC
  Administered 2020-02-27: 510 mg via INTRAVENOUS
  Filled 2020-02-27: qty 510

## 2020-02-27 NOTE — Patient Instructions (Signed)

## 2020-03-18 ENCOUNTER — Other Ambulatory Visit: Payer: Self-pay | Admitting: Internal Medicine

## 2020-03-18 DIAGNOSIS — E782 Mixed hyperlipidemia: Secondary | ICD-10-CM

## 2020-04-09 ENCOUNTER — Inpatient Hospital Stay: Payer: Medicare Other | Attending: Hematology and Oncology | Admitting: Hematology and Oncology

## 2020-04-09 ENCOUNTER — Inpatient Hospital Stay: Payer: Medicare Other

## 2020-04-09 ENCOUNTER — Other Ambulatory Visit: Payer: Self-pay | Admitting: Hematology and Oncology

## 2020-04-09 ENCOUNTER — Other Ambulatory Visit: Payer: Self-pay

## 2020-04-09 VITALS — BP 174/68 | HR 83 | Temp 97.0°F | Resp 20 | Ht 62.0 in | Wt 169.4 lb

## 2020-04-09 DIAGNOSIS — A048 Other specified bacterial intestinal infections: Secondary | ICD-10-CM

## 2020-04-09 DIAGNOSIS — E785 Hyperlipidemia, unspecified: Secondary | ICD-10-CM | POA: Insufficient documentation

## 2020-04-09 DIAGNOSIS — D500 Iron deficiency anemia secondary to blood loss (chronic): Secondary | ICD-10-CM

## 2020-04-09 DIAGNOSIS — I10 Essential (primary) hypertension: Secondary | ICD-10-CM | POA: Insufficient documentation

## 2020-04-09 DIAGNOSIS — N952 Postmenopausal atrophic vaginitis: Secondary | ICD-10-CM | POA: Insufficient documentation

## 2020-04-09 DIAGNOSIS — Z8679 Personal history of other diseases of the circulatory system: Secondary | ICD-10-CM | POA: Insufficient documentation

## 2020-04-09 DIAGNOSIS — K5730 Diverticulosis of large intestine without perforation or abscess without bleeding: Secondary | ICD-10-CM | POA: Insufficient documentation

## 2020-04-09 DIAGNOSIS — Z803 Family history of malignant neoplasm of breast: Secondary | ICD-10-CM | POA: Diagnosis not present

## 2020-04-09 DIAGNOSIS — K2950 Unspecified chronic gastritis without bleeding: Secondary | ICD-10-CM | POA: Diagnosis not present

## 2020-04-09 DIAGNOSIS — D509 Iron deficiency anemia, unspecified: Secondary | ICD-10-CM | POA: Diagnosis not present

## 2020-04-09 DIAGNOSIS — B9681 Helicobacter pylori [H. pylori] as the cause of diseases classified elsewhere: Secondary | ICD-10-CM | POA: Insufficient documentation

## 2020-04-09 LAB — CBC WITH DIFFERENTIAL (CANCER CENTER ONLY)
Abs Immature Granulocytes: 0.02 10*3/uL (ref 0.00–0.07)
Basophils Absolute: 0 10*3/uL (ref 0.0–0.1)
Basophils Relative: 1 %
Eosinophils Absolute: 0.2 10*3/uL (ref 0.0–0.5)
Eosinophils Relative: 2 %
HCT: 33.7 % — ABNORMAL LOW (ref 36.0–46.0)
Hemoglobin: 11.5 g/dL — ABNORMAL LOW (ref 12.0–15.0)
Immature Granulocytes: 0 %
Lymphocytes Relative: 28 %
Lymphs Abs: 1.8 10*3/uL (ref 0.7–4.0)
MCH: 31.1 pg (ref 26.0–34.0)
MCHC: 34.1 g/dL (ref 30.0–36.0)
MCV: 91.1 fL (ref 80.0–100.0)
Monocytes Absolute: 0.6 10*3/uL (ref 0.1–1.0)
Monocytes Relative: 10 %
Neutro Abs: 3.6 10*3/uL (ref 1.7–7.7)
Neutrophils Relative %: 59 %
Platelet Count: 262 10*3/uL (ref 150–400)
RBC: 3.7 MIL/uL — ABNORMAL LOW (ref 3.87–5.11)
RDW: 12.6 % (ref 11.5–15.5)
WBC Count: 6.2 10*3/uL (ref 4.0–10.5)
nRBC: 0 % (ref 0.0–0.2)

## 2020-04-09 LAB — CMP (CANCER CENTER ONLY)
ALT: 19 U/L (ref 0–44)
AST: 21 U/L (ref 15–41)
Albumin: 4.2 g/dL (ref 3.5–5.0)
Alkaline Phosphatase: 80 U/L (ref 38–126)
Anion gap: 10 (ref 5–15)
BUN: 19 mg/dL (ref 8–23)
CO2: 26 mmol/L (ref 22–32)
Calcium: 9.8 mg/dL (ref 8.9–10.3)
Chloride: 102 mmol/L (ref 98–111)
Creatinine: 1 mg/dL (ref 0.44–1.00)
GFR, Estimated: 59 mL/min — ABNORMAL LOW (ref >60)
Glucose, Bld: 105 mg/dL — ABNORMAL HIGH (ref 70–99)
Potassium: 4.1 mmol/L (ref 3.5–5.1)
Sodium: 138 mmol/L (ref 135–145)
Total Bilirubin: 0.5 mg/dL (ref 0.3–1.2)
Total Protein: 7.1 g/dL (ref 6.5–8.1)

## 2020-04-09 LAB — RETIC PANEL
Immature Retic Fract: 13.7 % (ref 2.3–15.9)
RBC.: 3.57 MIL/uL — ABNORMAL LOW (ref 3.87–5.11)
Retic Count, Absolute: 68.5 10*3/uL (ref 19.0–186.0)
Retic Ct Pct: 1.9 % (ref 0.4–3.1)
Reticulocyte Hemoglobin: 37.4 pg (ref >27.9)

## 2020-04-09 LAB — IRON AND TIBC
Iron: 103 ug/dL (ref 41–142)
Saturation Ratios: 34 % (ref 21–57)
TIBC: 304 ug/dL (ref 236–444)
UIBC: 201 ug/dL (ref 120–384)

## 2020-04-09 LAB — FERRITIN: Ferritin: 868 ng/mL — ABNORMAL HIGH (ref 11–307)

## 2020-04-09 LAB — SAVE SMEAR(SSMR), FOR PROVIDER SLIDE REVIEW

## 2020-04-15 ENCOUNTER — Encounter: Payer: Self-pay | Admitting: Hematology and Oncology

## 2020-04-15 NOTE — Progress Notes (Signed)
[place] [phone]   Fax:([phone]) [phone]  PROGRESS NOTE  Patient Care Team: Biagio Borg, MD as PCP - General (Internal Medicine) Sharyne Peach, MD (Ophthalmology)  Hematological/Oncological History # Normocytic Anemia 1) 10/13/2016: WBC 4.5, Hgb 12.2, MCV 90.2, Plt 279 2) 06/03/2017: WBC 8.0, Hgb 10.5, MCV 90.3, Plt 357 3) 08/22/2018: WBC 7.6, Hgb 10.6, MCV 85.9, Plt 329 4) 08/23/2019: WBC 6.6, Hgb 10.8, MCV 86.3, Plt 339. Iron 35, Sat 9%, TIBC 386 5) 11/01/2019: WBC 6.4, Hgb 10.9, Plt 294. Colonscopy and EGD performed. Found to have non bleeding small bowel AVM, colonic diverticulosis, and gastritis . Testing returned positive for H. Pylori.  6) 11/08/2019: establish care with Dr. Lorenso Courier  7) 02/12/2020: return visit. Hgb 10.8, down from 11.0 on prior visit 8) 02/20/2020-02/27/2020: 2 doses of IV feraheme 510mg    Interval History:  Alexis Price 74 y.o. female with medical history significant for normocytic anemia who presents for a follow up visit. The patient's last visit was on 02/12/2020 at which time her Hgb failed to rise (10.8 from 11.0 on 11/08/19) . In the interim since the last visit [name] has been well with no emergency room visits or hospitalizations. She received 2 doses of IV iron.   On exam today [name] notes the IV iron treatments went okay.  She notes that she did not have any side effects as a result of the infusions.  She notes that she does not get his tired as she used to and then she is able to do more physical work.  She notes that she is not taking her iron pills as prescribed.  She notes that she was having dark bowel movements while taking the iron pills but when she discontinued she was not having any.  She notes that she has not been taking a multivitamin but has been taking vitamin D.  She denies having any abdominal pain or any other overt signs of bleeding such as dark stools, bruising, bleeding, or nosebleeds.  A full 10  point ROS is listed below.  MEDICAL HISTORY:  Past Medical History:  Diagnosis Date   Anemia    Arthritis    Cancer ([place])    Diverticulosis    Essential hypertension 08/24/2007   Qualifier: Diagnosis of  By: Tiney Rouge CMA, [place]    Hyperlipidemia 08/24/2007   Qualifier: Diagnosis of  By: Tiney Rouge CMA, Ellison Hughs   medicaation - simvastatin 20 mg  On for > 1 year with no adverse side affects    HYPERTENSION    Postmenopausal atrophic vaginitis 05/12/2011   Temporal arteritis ([place]) 06/20/2017    SURGICAL HISTORY: Past Surgical History:  Procedure Laterality Date   COLON SURGERY     Emergent colectomy with creation of colosomy     flex sigmoidoscopy  1998   [place] of colostomy with salpingectomy and lysis of adhesion  04/2005   TONSILLECTOMY     age 17   TUBAL LIGATION  1976    SOCIAL HISTORY: Social History   Socioeconomic History   Marital status: Married    Spouse name: Not on file   Number of children: 2   Years of education: 16   Highest education level: Not on file  Occupational History   Occupation: Glass blower/designer  Tobacco Use   Smoking status: Never Smoker   Smokeless tobacco: Never Used  Scientific laboratory technician Use: Never used  Substance and Sexual Activity   Alcohol use:  Yes    Comment: 3-4 times per week   Drug use: No   Sexual activity: Yes    Partners: Male  Other Topics Concern   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 2 sons ' '69, '77. 4 grand-daughters. 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    Denies abuse and feels safe at home.    Social Determinants of Health   Financial Resource Strain:    Difficulty of Paying Living Expenses: Not on file  Food Insecurity:    Worried About Charity fundraiser in the Last Year: Not on file   [employer] of Food in the Last Year: Not on file  Transportation Needs:    Lack of Transportation (Medical): Not on file   Lack of  Transportation (Non-Medical): Not on file  Physical Activity:    Days of Exercise per Week: Not on file   Minutes of Exercise per Session: Not on file  Stress:    Feeling of Stress : Not on file  Social Connections:    Frequency of Communication with Friends and Family: Not on file   Frequency of Social Gatherings with Friends and Family: Not on file   Attends Religious Services: Not on file   Active Member of Clubs or Organizations: Not on file   Attends Archivist Meetings: Not on file   Marital Status: Not on file  Intimate Partner Violence:    Fear of Current or Ex-Partner: Not on file   Emotionally Abused: Not on file   Physically Abused: Not on file   Sexually Abused: Not on file    FAMILY HISTORY: Family History  Problem Relation Age of Onset   Hypertension Mother    Heart disease Mother    Coronary artery disease Mother        valve repalcements    ALLERGIES:  has No Known Allergies.  MEDICATIONS:  Current Outpatient Medications  Medication Sig Dispense Refill   amitriptyline (ELAVIL) 50 MG tablet TAKE 1 TABLET BY MOUTH AT BEDTIME 90 tablet 1   atorvastatin (LIPITOR) 20 MG tablet Take 1 tablet by mouth once daily 90 tablet 1   iron polysaccharides (NU-IRON) 150 MG capsule Take 1 capsule (150 mg total) by mouth daily. (Patient not taking: Reported on 04/09/2020) 90 capsule 1   lisinopril-hydrochlorothiazide (ZESTORETIC) 20-12.5 MG tablet Take 2 tablets by mouth daily. 180 tablet 3   Multiple Vitamin (MULTIVITAMIN) tablet Take 1 tablet by mouth daily. Takes Nature's Bounty Vit D 3 Daily     No current facility-administered medications for this visit.    REVIEW OF SYSTEMS:   Constitutional: ( - ) fevers, ( - )  chills , ( - ) night sweats Eyes: ( - ) blurriness of vision, ( - ) double vision, ( - ) watery eyes Ears, nose, mouth, throat, and face: ( - ) mucositis, ( - ) sore throat Respiratory: ( - ) cough, ( - ) dyspnea, ( - )  wheezes Cardiovascular: ( - ) palpitation, ( - ) chest discomfort, ( - ) lower extremity swelling Gastrointestinal:  ( - ) nausea, ( - ) heartburn, ( - ) change in bowel habits Skin: ( - ) abnormal skin rashes Lymphatics: ( - ) new lymphadenopathy, ( - ) easy bruising Neurological: ( - ) numbness, ( - ) tingling, ( - ) new weaknesses Behavioral/Psych: ( - ) mood change, ( - ) new changes  All other systems were  reviewed with the patient and are negative.  PHYSICAL EXAMINATION:  Vitals:   04/09/20 1031  BP: (!) 174/68  Pulse: 83  Resp: 20  Temp: (!) 97 F (36.1 C)  SpO2: 100%   Filed Weights   04/09/20 1031  Weight: 169 lb 6.4 oz (76.8 kg)    GENERAL: well appearing elderly Caucasian female, alert, no distress and comfortable SKIN: skin color, texture, turgor are normal, no rashes or significant lesions EYES: conjunctiva are pink and non-injected, sclera clear LUNGS: clear to auscultation and percussion with normal breathing effort HEART: regular rate & rhythm and no murmurs and no lower extremity edema Musculoskeletal: no cyanosis of digits and no clubbing  PSYCH: alert & oriented x 3, fluent speech NEURO: no focal motor/sensory deficits  LABORATORY DATA:  I have reviewed the data as listed CBC Latest Ref Rng & Units 04/09/2020 02/12/2020 11/08/2019  WBC 4.0 - 10.5 K/uL 6.2 5.8 5.8  Hemoglobin 12.0 - 15.0 g/dL 11.5(L) 10.8(L) 11.0(L)  Hematocrit 36 - 46 % 33.7(L) 32.3(L) 33.8(L)  Platelets 150 - 400 K/uL 262 271 293    CMP Latest Ref Rng & Units 04/09/2020 02/12/2020 11/08/2019  Glucose 70 - 99 mg/dL 105(H) 130(H) 109(H)  BUN 8 - 23 mg/dL 19 19 25(H)  Creatinine 0.44 - 1.00 mg/dL 1.00 1.12(H) 1.11(H)  Sodium 135 - 145 mmol/L 138 140 142  Potassium 3.5 - 5.1 mmol/L 4.1 4.2 5.2(H)  Chloride 98 - 111 mmol/L 102 103 103  CO2 22 - 32 mmol/L 26 28 28   Calcium 8.9 - 10.3 mg/dL 9.8 9.8 10.0  Total Protein 6.5 - 8.1 g/dL 7.1 7.2 7.5  Total Bilirubin 0.3 - 1.2 mg/dL 0.5 0.4 0.3   Alkaline Phos 38 - 126 U/L 80 89 87  AST 15 - 41 U/L 21 21 16   ALT 0 - 44 U/L 19 14 12     No results found for: MPROTEIN Lab Results  Component Value Date   KPAFRELGTCHN 13.9 11/08/2019   LAMBDASER 11.1 11/08/2019   KAPLAMBRATIO 1.25 11/08/2019     RADIOGRAPHIC STUDIES: No results found.  ASSESSMENT & PLAN PRICILA BRIDGE 74 y.o. female with medical history significant for normocytic anemia who presents for a follow up visit.  On exam today Ms. Sassi notes that her energy level has improved, however her labs do not show much improvement in the way of her iron stores.  As such I recommended we proceed with IV Feraheme 510 mg q. 7 days x 2 doses in order to help bolster her iron stores and improve her normocytic anemia.     She has had no overt signs or symptoms of bleeding at this time and the etiology is most likely either poor iron absorption or continued leaking from her AVM.  I would recommend that she continue her p.o. iron therapy at this time and after she has received the IV iron.   On exam today Ms. Dieu's hemoglobin has bumped to 11.5 from 10.8 on her last visit on 02/12/2020.  Her iron levels appear robust.  Given this increase in improvement her symptoms I recommend that she follow-up in 3 months time for further evaluation.  Additionally because her hemoglobin has not completely normalized I would recommend we check an H. pylori stool antigen in order to assure that she has cleared the infection.  Ms. Bordenave is voiced understanding of the plan moving forward.  # Normocytic Anemia --patient completed IV feraheme 510mg  q 7days x 2 doses. PO iron therapy had  not increased her Hgb and her iron stores have elevated minimally. --continue ferrous sulfate 325mg  PO daily with a source of vitamin C --patient does note energy improvement, but Hgb is only modestly increased (iron levels normalized) --patient had endoscopy earlier this year which revealed non bleeding small  bowel AVM, colonic diverticulosis, and gastritis . He also tested H. Pylori positive.  --recommend f/u stool H. Pylori to be ordered today --RTC in 3 months time to reassess.   Orders Placed This Encounter  Procedures   H. pylori antigen, stool    Standing Status:   Future    Standing Expiration Date:   04/09/2021    All questions were answered. The patient knows to call the clinic with any problems, questions or concerns.  A total of more than 30 minutes were spent on this encounter and over half of that time was spent on counseling and coordination of care as outlined above.   Ledell Peoples, MD Department of Hematology/Oncology [place] at [facility] Phone: [phone] Pager: [phone] Email: [email or web address]  04/15/2020 6:56 PM

## 2020-04-16 ENCOUNTER — Telehealth: Payer: Self-pay | Admitting: Hematology and Oncology

## 2020-04-16 NOTE — Telephone Encounter (Signed)
Scheduled aptp per 11/17 sch msg - mailed letter with appt date and time

## 2020-06-03 ENCOUNTER — Telehealth: Payer: Medicare Other | Admitting: Nurse Practitioner

## 2020-06-03 DIAGNOSIS — T753XXA Motion sickness, initial encounter: Secondary | ICD-10-CM | POA: Diagnosis not present

## 2020-06-03 MED ORDER — MECLIZINE HCL 25 MG PO TABS: 25.0000 mg | ORAL_TABLET | Freq: Three times a day (TID) | ORAL | 0 refills | Status: DC | PRN

## 2020-06-03 NOTE — Progress Notes (Signed)
E Visit for Motion Sickness  We are sorry that you are not feeling well. Here is how we plan to help!  Based on what you have shared with me it looks like you have symptoms of motion sickness.  I have prescribed a medication that will help prevent or alleviate your symptoms:  Meclizine 25mg by mouth three times per day as needed for nausea/motion sickness   Prevention:  You might feel better if you keep your eyes focused on outside while you are in motion. For example, if you are in a car, sit in the front and look in the direction you are moving; if you are on a boat, stay on the deck and look to the horizon. This helps make what you see match the movement you are feeling, and so you are less likely to feel sick.  You should also avoid reading, watching a movie, texting or reading messages, or looking at things close to you inside the vehicle you are riding in.  . Use the seat head rest. Lean your head against the back of the seat or head rest when traveling in vehicles with seats to minimize head movements.  . On a ship: When making your reservations, choose a cabin in the middle of the ship and near the waterline. When on board, go up on deck and focus on the horizon.  . In an airplane: Request a window seat and look out the window. A seat over the front edge of the wing is the most preferable spot (the degree of motion is the lowest here). Direct the air vent to blow cool air on your face.  . On a train: Always face forward and sit near a window.  . In a vehicle: Sit in the front seat; if you are the passenger, look at the scenery in the distance. For some people, driving the vehicle (rather than being a passenger) is an instant remedy.  . Avoid others who have become nauseous with motion sickness. Seeing and smelling others who have motion sickness may cause you to become sick.  GET HELP RIGHT AWAY IF:   Your symptoms do not improve or worsen within 2 days after  treatment.   You cannot keep down fluids after trying the medication.   Other associated symptoms such as severe headache, visual field changes, fever, or intractable nausea and vomiting.  MAKE SURE YOU:   Understand these instructions.  Will watch your condition.  Will get help right away if you are not doing well or get worse.  Thank you for choosing an e-visit.  Your e-visit answers were reviewed by a board certified advanced clinical practitioner to complete your personal care plan. Depending upon the condition, your plan could have included both over the counter or prescription medications.  Please review your pharmacy choice. Be sure that the pharmacy you have chosen is open so that you can pick up your prescription now.  If there is a problem you may message your provider in MyChart to have the prescription routed to another pharmacy.  Your safety is important to us. If you have drug allergies check your prescription carefully.   For the next 24 hours, you can use MyChart to ask questions about today's visit, request a non-urgent call back, or ask for a work or school excuse from your e-visit provider.  You will get an e-mail in the next two days asking about your experience. I hope that your e-visit has been valuable and will speed   5-10 minutes spent reviewing and documenting in chart.  

## 2020-06-10 ENCOUNTER — Encounter: Payer: Self-pay | Admitting: Internal Medicine

## 2020-06-10 ENCOUNTER — Other Ambulatory Visit: Payer: Self-pay

## 2020-06-10 ENCOUNTER — Ambulatory Visit (INDEPENDENT_AMBULATORY_CARE_PROVIDER_SITE_OTHER): Payer: Medicare Other | Admitting: Internal Medicine

## 2020-06-10 VITALS — BP 142/70 | HR 77 | Temp 98.9°F | Wt 170.0 lb

## 2020-06-10 DIAGNOSIS — E782 Mixed hyperlipidemia: Secondary | ICD-10-CM | POA: Diagnosis not present

## 2020-06-10 DIAGNOSIS — R739 Hyperglycemia, unspecified: Secondary | ICD-10-CM

## 2020-06-10 DIAGNOSIS — R519 Headache, unspecified: Secondary | ICD-10-CM | POA: Diagnosis not present

## 2020-06-10 DIAGNOSIS — R110 Nausea: Secondary | ICD-10-CM

## 2020-06-10 DIAGNOSIS — R42 Dizziness and giddiness: Secondary | ICD-10-CM

## 2020-06-10 DIAGNOSIS — I10 Essential (primary) hypertension: Secondary | ICD-10-CM | POA: Diagnosis not present

## 2020-06-10 MED ORDER — DIAZEPAM 5 MG PO TABS: ORAL_TABLET | ORAL | 1 refills | Status: DC

## 2020-06-10 MED ORDER — MECLIZINE HCL 25 MG PO TABS: 25.0000 mg | ORAL_TABLET | Freq: Three times a day (TID) | ORAL | 1 refills | Status: DC | PRN

## 2020-06-10 NOTE — Patient Instructions (Signed)
Please take all new medication as prescribed  - the valium 5 mg per day for 3 days  Please continue all other medications as before, including the meclizine as needed  Please have the pharmacy call with any other refills you may need.  Please continue your efforts at being more active, low cholesterol diet, and weight control.  Please keep your appointments with your specialists as you may have planned  You will be contacted regarding the referral for: Head MRI, and vestibular rehab  You will be contacted by phone if any changes need to be made immediately.  Otherwise, you will receive a letter about your results with an explanation, but please check with MyChart first.  Please remember to sign up for MyChart if you have not done so, as this will be important to you in the future with finding out test results, communicating by private email, and scheduling acute appointments online when needed.

## 2020-06-10 NOTE — Assessment & Plan Note (Addendum)
Now 8 days from sudden onset symptoms with intermittent HA and nausea, but no other focal neuro s/s, on statin but not asa - does not want to start for now, more or less constant, worse later in the day, not amenable to meclizine at all per pt; no falls or other sinus symptoms,  No fever or other overt acute illness, no evidence for low volume,  ENT exam benign today  For valium 5 qd x 3, cont try meclizine prn, declines asa, cont statin, also for head MRI r/o post cva, and vestibular rehab referral

## 2020-06-10 NOTE — Progress Notes (Signed)
Established Patient Office Visit  Subjective:  Patient ID: Alexis Price, female    DOB: 1945-07-16  Age: 75 y.o. MRN: [id number]  CC:  Chief Complaint  Patient presents with  . Dizziness    Pt states her balance has been off x's 1 week. Throughout the day dizziness gets worse. Also been nauseated   as well as htn, hld, hyperglycemia          HPI:  Alexis Price is a 75 y.o. female here to c/o 1 wk onset vertigo like sensation with intermittent nausea for 1 wk, mod to severe almost constant, meclizine not helping enough, also with occasional HA and vomit x 1- 2 times per day with dry heaves; denies fever; ear or sinus symptoms such as pain, swelling, congestion or blood.  Pt denies chest pain, increased sob or doe, wheezing, orthopnea, PND, increased LE swelling, palpitations, dizziness or syncope.   Pt denies polydipsia, polyuria.  No falls.          Wt Readings from Last 3 Encounters:  06/10/20 170 lb (77.1 kg)  04/09/20 169 lb 6.4 oz (76.8 kg)  02/12/20 166 lb 9.6 oz (75.6 kg)   BP Readings from Last 3 Encounters:  06/10/20 (!) 142/70  04/09/20 (!) 174/68  02/27/20 (!) 188/78         Past Medical History:  Diagnosis Date  . Anemia   . Arthritis   . Cancer ([place])   . Diverticulosis   . Essential hypertension 08/24/2007   Qualifier: Diagnosis of  By: Tiney Rouge CMA, Ellison Hughs    . HYPERLIPIDEMIA   . Hyperlipidemia 08/24/2007   Qualifier: Diagnosis of  By: Tiney Rouge CMA, Ellison Hughs   medicaation - simvastatin 20 mg  On for > 1 year with no adverse side affects   . HYPERTENSION   . Postmenopausal atrophic vaginitis 05/12/2011  . Temporal arteritis ([facility]) 06/20/2017   Past Surgical History:  Procedure Laterality Date  . COLON SURGERY    . Emergent colectomy with creation of colosomy    . flex sigmoidoscopy  1998  . [place] of colostomy with salpingectomy and lysis of adhesion  04/2005  . TONSILLECTOMY     age 46  . TUBAL LIGATION  1976    reports that she has never  smoked. She has never used smokeless tobacco. She reports current alcohol use. She reports that she does not use drugs. family history includes Coronary artery disease in her mother; Heart disease in her mother; Hypertension in her mother. No Known Allergies Current Outpatient Medications on File Prior to Visit  Medication Sig Dispense Refill  . amitriptyline (ELAVIL) 50 MG tablet TAKE 1 TABLET BY MOUTH AT BEDTIME 90 tablet 1  . atorvastatin (LIPITOR) 20 MG tablet Take 1 tablet by mouth once daily 90 tablet 1  . lisinopril-hydrochlorothiazide (ZESTORETIC) 20-12.5 MG tablet Take 2 tablets by mouth daily. 180 tablet 3  . Multiple Vitamin (MULTIVITAMIN) tablet Take 1 tablet by mouth daily. Takes Nature's Bounty Vit D 3 Daily    . iron polysaccharides (NU-IRON) 150 MG capsule Take 1 capsule (150 mg total) by mouth daily. (Patient not taking: No sig reported) 90 capsule 1   No current facility-administered medications on file prior to visit.        ROS:  All others reviewed and negative.  Objective        PE:  BP (!) 142/70 (BP Location: Left Arm)   Pulse 77   Temp 98.9 F (37.2 C) (Oral)  Wt 170 lb (77.1 kg)   SpO2 95%   BMI 31.09 kg/m                 Constitutional: Pt appears in NAD               HENT: Head: NCAT.                Right Ear: External ear normal.                 Left Ear: External ear normal.                Eyes: . Pupils are equal, round, and reactive to light. Conjunctivae and EOM are normal               Nose: without d/c or deformity               Neck: Neck supple. Gross normal ROM               Cardiovascular: Normal rate and regular rhythm.                 Pulmonary/Chest: Effort normal and breath sounds without rales or wheezing.                Abd:  Soft, NT, ND, + BS, no organomegaly               Neurological: Pt is alert. At baseline orientation, motor 5/5 intact, cn 2-12 intact               Skin: Skin is warm. No rashes, no other new lesions, LE edema -  none               Psychiatric: Pt behavior is normal without agitation   Assessment/Plan:  ANNETE Price is a 75 y.o. White or Caucasian [1] female with  has a past medical history of Anemia, Arthritis, Cancer ([place]), Diverticulosis, Essential hypertension (08/24/2007), HYPERLIPIDEMIA, Hyperlipidemia (08/24/2007), HYPERTENSION, Postmenopausal atrophic vaginitis (05/12/2011), and Temporal arteritis ([place]) (06/20/2017).   Assessment Plan  See problem oriented assessment and plan Labs reviewed for each problem: Lab Results  Component Value Date   WBC 6.2 04/09/2020   HGB 11.5 (L) 04/09/2020   HCT 33.7 (L) 04/09/2020   PLT 262 04/09/2020   GLUCOSE 105 (H) 04/09/2020   CHOL 204 (H) 08/23/2019   TRIG 178 (H) 08/23/2019   HDL 64 08/23/2019   LDLDIRECT 95.0 07/06/2017   LDLCALC 110 (H) 08/23/2019   ALT 19 04/09/2020   AST 21 04/09/2020   NA 138 04/09/2020   K 4.1 04/09/2020   CL 102 04/09/2020   CREATININE 1.00 04/09/2020   BUN 19 04/09/2020   CO2 26 04/09/2020   TSH 1.57 08/23/2019   HGBA1C 6.0 (H) 08/23/2019    Micro: none  Cardiac tracings I have personally interpreted today:  none  Pertinent Radiological findings (summarize): none    Health Maintenance Due  Topic Date Due  . COVID-19 Vaccine (1) Never done    There are no preventive care reminders to display for this patient.  Lab Results  Component Value Date   TSH 1.57 08/23/2019   Lab Results  Component Value Date   WBC 6.2 04/09/2020   HGB 11.5 (L) 04/09/2020   HCT 33.7 (L) 04/09/2020   MCV 91.1 04/09/2020   PLT 262 04/09/2020   Lab Results  Component Value Date   NA 138 04/09/2020   K 4.1 04/09/2020  CO2 26 04/09/2020   GLUCOSE 105 (H) 04/09/2020   BUN 19 04/09/2020   CREATININE 1.00 04/09/2020   BILITOT 0.5 04/09/2020   ALKPHOS 80 04/09/2020   AST 21 04/09/2020   ALT 19 04/09/2020   PROT 7.1 04/09/2020   ALBUMIN 4.2 04/09/2020   CALCIUM 9.8 04/09/2020   ANIONGAP 10 04/09/2020   GFR 62.17  08/22/2018   Lab Results  Component Value Date   CHOL 204 (H) 08/23/2019   Lab Results  Component Value Date   HDL 64 08/23/2019   Lab Results  Component Value Date   LDLCALC 110 (H) 08/23/2019   Lab Results  Component Value Date   TRIG 178 (H) 08/23/2019   Lab Results  Component Value Date   CHOLHDL 3.2 08/23/2019   Lab Results  Component Value Date   HGBA1C 6.0 (H) 08/23/2019      Assessment & Plan:   Problem List Items Addressed This Visit      High   Vertigo - Primary    Now 8 days from sudden onset symptoms with intermittent HA and nausea, but no other focal neuro s/s, on statin but not asa - does not want to start for now, more or less constant, worse later in the day, not amenable to meclizine at all per pt; no falls or other sinus symptoms,  No fever or other overt acute illness, no evidence for low volume,  ENT exam benign today  For valium 5 qd x 3, cont try meclizine prn, declines asa, cont statin, also for head MRI r/o post cva, and vestibular rehab referral      Relevant Orders   MR Brain Wo Contrast   Ambulatory referral to Physical Therapy     Medium   Hyperlipidemia    Lab Results  Component Value Date   LDLCALC 110 (H) 08/23/2019   Stable, pt to continue current statin lipitor - declines lab or increased med today       Hyperglycemia    Lab Results  Component Value Date   HGBA1C 6.0 (H) 08/23/2019   Stable, pt to continue current medical treatment  - diet   Current Outpatient Medications (Cardiovascular):  .  atorvastatin (LIPITOR) 20 MG tablet, Take 1 tablet by mouth once daily .  lisinopril-hydrochlorothiazide (ZESTORETIC) 20-12.5 MG tablet, Take 2 tablets by mouth daily.    Current Outpatient Medications (Hematological):  .  iron polysaccharides (NU-IRON) 150 MG capsule, Take 1 capsule (150 mg total) by mouth daily. (Patient not taking: No sig reported)  Current Outpatient Medications (Other):  .  amitriptyline (ELAVIL) 50 MG  tablet, TAKE 1 TABLET BY MOUTH AT BEDTIME .  diazepam (VALIUM) 5 MG tablet, 1 tab by mouth per day x 3 days .  Multiple Vitamin (MULTIVITAMIN) tablet, Take 1 tablet by mouth daily. Takes Nature's Bounty Vit D 3 Daily .  meclizine (ANTIVERT) 25 MG tablet, Take 1 tablet (25 mg total) by mouth 3 (three) times daily as needed for dizziness.       Essential hypertension    BP Readings from Last 3 Encounters:  06/10/20 (!) 142/70  04/09/20 (!) 174/68  02/27/20 (!) 188/78   Stable, pt to continue medical treatment zestoretic   Current Outpatient Medications (Cardiovascular):  .  atorvastatin (LIPITOR) 20 MG tablet, Take 1 tablet by mouth once daily .  lisinopril-hydrochlorothiazide (ZESTORETIC) 20-12.5 MG tablet, Take 2 tablets by mouth daily.    Current Outpatient Medications (Hematological):  .  iron polysaccharides (NU-IRON) 150 MG capsule,  Take 1 capsule (150 mg total) by mouth daily. (Patient not taking: No sig reported)  Current Outpatient Medications (Other):  .  amitriptyline (ELAVIL) 50 MG tablet, TAKE 1 TABLET BY MOUTH AT BEDTIME .  diazepam (VALIUM) 5 MG tablet, 1 tab by mouth per day x 3 days .  Multiple Vitamin (MULTIVITAMIN) tablet, Take 1 tablet by mouth daily. Takes Nature's Bounty Vit D 3 Daily .  meclizine (ANTIVERT) 25 MG tablet, Take 1 tablet (25 mg total) by mouth 3 (three) times daily as needed for dizziness.        Other Visit Diagnoses    Dizziness       Relevant Orders   MR Brain Wo Contrast   Ambulatory referral to Physical Therapy   Acute intractable headache, unspecified headache type       Relevant Orders   MR Brain Wo Contrast   Nausea       Relevant Orders   MR Brain Wo Contrast      Meds ordered this encounter  Medications  . diazepam (VALIUM) 5 MG tablet    Sig: 1 tab by mouth per day x 3 days    Dispense:  3 tablet    Refill:  1  . meclizine (ANTIVERT) 25 MG tablet    Sig: Take 1 tablet (25 mg total) by mouth 3 (three) times daily as  needed for dizziness.    Dispense:  30 tablet    Refill:  1    Follow-up: Return if symptoms worsen or fail to improve.   Cathlean Cower, MD

## 2020-06-14 ENCOUNTER — Encounter: Payer: Self-pay | Admitting: Internal Medicine

## 2020-06-14 NOTE — Assessment & Plan Note (Signed)
BP Readings from Last 3 Encounters:  06/10/20 (!) 142/70  04/09/20 (!) 174/68  02/27/20 (!) 188/78   Stable, pt to continue medical treatment zestoretic   Current Outpatient Medications (Cardiovascular):  .  atorvastatin (LIPITOR) 20 MG tablet, Take 1 tablet by mouth once daily .  lisinopril-hydrochlorothiazide (ZESTORETIC) 20-12.5 MG tablet, Take 2 tablets by mouth daily.    Current Outpatient Medications (Hematological):  .  iron polysaccharides (NU-IRON) 150 MG capsule, Take 1 capsule (150 mg total) by mouth daily. (Patient not taking: No sig reported)  Current Outpatient Medications (Other):  .  amitriptyline (ELAVIL) 50 MG tablet, TAKE 1 TABLET BY MOUTH AT BEDTIME .  diazepam (VALIUM) 5 MG tablet, 1 tab by mouth per day x 3 days .  Multiple Vitamin (MULTIVITAMIN) tablet, Take 1 tablet by mouth daily. Takes Nature's Bounty Vit D 3 Daily .  meclizine (ANTIVERT) 25 MG tablet, Take 1 tablet (25 mg total) by mouth 3 (three) times daily as needed for dizziness.

## 2020-06-14 NOTE — Assessment & Plan Note (Signed)
Lab Results  Component Value Date   HGBA1C 6.0 (H) 08/23/2019   Stable, pt to continue current medical treatment  - diet   Current Outpatient Medications (Cardiovascular):  .  atorvastatin (LIPITOR) 20 MG tablet, Take 1 tablet by mouth once daily .  lisinopril-hydrochlorothiazide (ZESTORETIC) 20-12.5 MG tablet, Take 2 tablets by mouth daily.    Current Outpatient Medications (Hematological):  .  iron polysaccharides (NU-IRON) 150 MG capsule, Take 1 capsule (150 mg total) by mouth daily. (Patient not taking: No sig reported)  Current Outpatient Medications (Other):  .  amitriptyline (ELAVIL) 50 MG tablet, TAKE 1 TABLET BY MOUTH AT BEDTIME .  diazepam (VALIUM) 5 MG tablet, 1 tab by mouth per day x 3 days .  Multiple Vitamin (MULTIVITAMIN) tablet, Take 1 tablet by mouth daily. Takes Nature's Bounty Vit D 3 Daily .  meclizine (ANTIVERT) 25 MG tablet, Take 1 tablet (25 mg total) by mouth 3 (three) times daily as needed for dizziness.

## 2020-06-14 NOTE — Assessment & Plan Note (Addendum)
Lab Results  Component Value Date   LDLCALC 110 (H) 08/23/2019   Stable, pt to continue current statin lipitor - declines lab or increased med today

## 2020-06-22 ENCOUNTER — Telehealth: Payer: Self-pay | Admitting: Hematology and Oncology

## 2020-06-22 NOTE — Telephone Encounter (Signed)
Left message with rescheduled appointment. Gave option to call back to reschedule if needed. 

## 2020-06-27 ENCOUNTER — Other Ambulatory Visit: Payer: Medicare Other

## 2020-07-06 ENCOUNTER — Other Ambulatory Visit: Payer: Medicare Other

## 2020-07-14 ENCOUNTER — Other Ambulatory Visit: Payer: Self-pay

## 2020-07-14 ENCOUNTER — Encounter: Payer: Self-pay | Admitting: Internal Medicine

## 2020-07-14 ENCOUNTER — Ambulatory Visit
Admission: RE | Admit: 2020-07-14 | Discharge: 2020-07-14 | Disposition: A | Discharge status: Home / Self Care | Payer: Medicare Other | Source: Ambulatory Visit | Attending: Internal Medicine | Admitting: Internal Medicine

## 2020-07-14 DIAGNOSIS — R110 Nausea: Secondary | ICD-10-CM | POA: Diagnosis not present

## 2020-07-14 DIAGNOSIS — R42 Dizziness and giddiness: Secondary | ICD-10-CM

## 2020-07-14 DIAGNOSIS — G319 Degenerative disease of nervous system, unspecified: Secondary | ICD-10-CM | POA: Diagnosis not present

## 2020-07-14 DIAGNOSIS — R519 Headache, unspecified: Secondary | ICD-10-CM

## 2020-07-14 DIAGNOSIS — I6782 Cerebral ischemia: Secondary | ICD-10-CM | POA: Diagnosis not present

## 2020-07-17 ENCOUNTER — Other Ambulatory Visit: Payer: Medicare Other

## 2020-07-17 ENCOUNTER — Ambulatory Visit: Payer: Medicare Other | Admitting: Hematology and Oncology

## 2020-07-24 ENCOUNTER — Other Ambulatory Visit: Payer: Medicare Other

## 2020-07-24 ENCOUNTER — Ambulatory Visit: Payer: Medicare Other | Admitting: Hematology and Oncology

## 2020-08-02 ENCOUNTER — Other Ambulatory Visit: Payer: Self-pay | Admitting: Hematology and Oncology

## 2020-08-02 DIAGNOSIS — D500 Iron deficiency anemia secondary to blood loss (chronic): Secondary | ICD-10-CM

## 2020-08-02 NOTE — Progress Notes (Signed)
Rescheduled

## 2020-08-03 ENCOUNTER — Inpatient Hospital Stay: Payer: Medicare Other | Admitting: Hematology and Oncology

## 2020-08-03 ENCOUNTER — Inpatient Hospital Stay: Payer: Medicare Other

## 2020-08-03 ENCOUNTER — Telehealth: Payer: Self-pay | Admitting: Hematology and Oncology

## 2020-08-03 DIAGNOSIS — D649 Anemia, unspecified: Secondary | ICD-10-CM

## 2020-08-03 DIAGNOSIS — K2950 Unspecified chronic gastritis without bleeding: Secondary | ICD-10-CM

## 2020-08-03 DIAGNOSIS — D500 Iron deficiency anemia secondary to blood loss (chronic): Secondary | ICD-10-CM

## 2020-08-03 NOTE — Telephone Encounter (Signed)
Returned call to reschedule today's appointment per 3/7 schedule message. Rescheduled appointment due to death in the family and patient is aware of changes.

## 2020-08-11 ENCOUNTER — Other Ambulatory Visit: Payer: Self-pay | Admitting: Hematology and Oncology

## 2020-08-11 DIAGNOSIS — D500 Iron deficiency anemia secondary to blood loss (chronic): Secondary | ICD-10-CM

## 2020-08-11 NOTE — Progress Notes (Signed)
[place] [phone]   [phone]  PROGRESS NOTE  Patient Care Team: Biagio Borg, MD as PCP - General (Internal Medicine) Sharyne Peach, MD (Ophthalmology)  Hematological/Oncological History # Normocytic Anemia # Iron Deficiency Anemia 1) 10/13/2016: WBC 4.5, Hgb 12.2, MCV 90.2, Plt 279 2) 06/03/2017: WBC 8.0, Hgb 10.5, MCV 90.3, Plt 357 3) 08/22/2018: WBC 7.6, Hgb 10.6, MCV 85.9, Plt 329 4) 08/23/2019: WBC 6.6, Hgb 10.8, MCV 86.3, Plt 339. Iron 35, Sat 9%, TIBC 386 5) 11/01/2019: WBC 6.4, Hgb 10.9, Plt 294. Colonscopy and EGD performed. Found to have non bleeding small bowel AVM, colonic diverticulosis, and gastritis . Testing returned positive for H. Pylori.  6) 11/08/2019: establish care with Dr. Lorenso Courier  7) 02/12/2020: return visit. Hgb 10.8, down from 11.0 on prior visit 8) 02/20/2020-02/27/2020: 2 doses of IV feraheme 510mg    Interval History:  Alexis Price 75 y.o. female with medical history significant for normocytic anemia who presents for a follow up visit. The patient's last visit was on 03/30/2020 . In the interim since the last visit Alexis Price has been well with no emergency room visits or hospitalizations.   On exam today Alexis Price notes that overall she has been "doing good".  She notes that she stopped having issues with shortness of breath or fatigue.  She has been having some episodes of vertigo and the reason for this is not clear.  She had no overt sources of bleeding, bruising, or dark stools.  She does report occasional taste of blood in her mouth.  She is currently taking iron pills regularly and that does not cause any stomach upset or constipation.  She does not eat red meat simply because it is something her husband is not able to eat in his diet.  She also denies any abdominal discomfort at this time.  She was unable to complete a stool test during her last visit but has the equipment to perform this today.  She denies  having any abdominal pain or any other overt signs of bleeding such as dark stools, bruising, bleeding, or nosebleeds.  A full 10 point ROS is listed below.  MEDICAL HISTORY:  Past Medical History:  Diagnosis Date  . Anemia   . Arthritis   . Cancer ([place])   . Diverticulosis   . Essential hypertension 08/24/2007   Qualifier: Diagnosis of  By: Tiney Rouge CMA, Ellison Hughs    . HYPERLIPIDEMIA   . Hyperlipidemia 08/24/2007   Qualifier: Diagnosis of  By: Tiney Rouge CMA, Ellison Hughs   medicaation - simvastatin 20 mg  On for > 1 year with no adverse side affects   . HYPERTENSION   . Postmenopausal atrophic vaginitis 05/12/2011  . Temporal arteritis ([place]) 06/20/2017    SURGICAL HISTORY: Past Surgical History:  Procedure Laterality Date  . COLON SURGERY    . Emergent colectomy with creation of colosomy    . flex sigmoidoscopy  1998  . [place] of colostomy with salpingectomy and lysis of adhesion  04/2005  . TONSILLECTOMY     age 11  . TUBAL LIGATION  1976    SOCIAL HISTORY: Social History   Socioeconomic History  . Marital status: Married    Spouse name: Not on file  . Number of children: 2  . Years of education: 9  . Highest education level: Not on file  Occupational History  . Occupation: Glass blower/designer  Tobacco Use  . Smoking status: Never Smoker  . Smokeless tobacco: Never Used  Vaping Use  .  Vaping Use: Never used  Substance and Sexual Activity  . Alcohol use: Yes    Comment: 3-4 times per week  . Drug use: No  . Sexual activity: Yes    Partners: Male  Other Topics Concern  . 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 2 sons ' '69, '77. 4 grand-daughters. 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    Denies abuse and feels safe at home.    Social Determinants of Health   Financial Resource Strain: Not on file  Food Insecurity: Not on file  Transportation Needs: Not on file  Physical Activity: Not on file  Stress: Not on file  Social  Connections: Not on file  Intimate Partner Violence: Not on file    FAMILY HISTORY: Family History  Problem Relation Age of Onset  . Hypertension Mother   . Heart disease Mother   . Coronary artery disease Mother        valve repalcements    ALLERGIES:  has No Known Allergies.  MEDICATIONS:  Current Outpatient Medications  Medication Sig Dispense Refill  . amitriptyline (ELAVIL) 50 MG tablet TAKE 1 TABLET BY MOUTH AT BEDTIME 90 tablet 1  . atorvastatin (LIPITOR) 20 MG tablet Take 1 tablet by mouth once daily 90 tablet 1  . diazepam (VALIUM) 5 MG tablet 1 tab by mouth per day x 3 days 3 tablet 1  . iron polysaccharides (NU-IRON) 150 MG capsule Take 1 capsule (150 mg total) by mouth daily. (Patient not taking: No sig reported) 90 capsule 1  . lisinopril-hydrochlorothiazide (ZESTORETIC) 20-12.5 MG tablet Take 2 tablets by mouth daily. 180 tablet 3  . meclizine (ANTIVERT) 25 MG tablet Take 1 tablet (25 mg total) by mouth 3 (three) times daily as needed for dizziness. 30 tablet 1  . Multiple Vitamin (MULTIVITAMIN) tablet Take 1 tablet by mouth daily. Takes Nature's Bounty Vit D 3 Daily     No current facility-administered medications for this visit.    REVIEW OF SYSTEMS:   Constitutional: ( - ) fevers, ( - )  chills , ( - ) night sweats Eyes: ( - ) blurriness of vision, ( - ) double vision, ( - ) watery eyes Ears, nose, mouth, throat, and face: ( - ) mucositis, ( - ) sore throat Respiratory: ( - ) cough, ( - ) dyspnea, ( - ) wheezes Cardiovascular: ( - ) palpitation, ( - ) chest discomfort, ( - ) lower extremity swelling Gastrointestinal:  ( - ) nausea, ( - ) heartburn, ( - ) change in bowel habits Skin: ( - ) abnormal skin rashes Lymphatics: ( - ) new lymphadenopathy, ( - ) easy bruising Neurological: ( - ) numbness, ( - ) tingling, ( - ) new weaknesses Behavioral/Psych: ( - ) mood change, ( - ) new changes  All other systems were reviewed with the patient and are  negative.  PHYSICAL EXAMINATION:  Vitals:   08/12/20 0759  BP: (!) 153/62  Pulse: 65  Resp: 18  Temp: 97.8 F (36.6 C)  SpO2: 100%   Filed Weights   08/12/20 0759  Weight: 167 lb 3.2 oz (75.8 kg)    GENERAL: well appearing elderly Caucasian female, alert, no distress and comfortable SKIN: skin color, texture, turgor are normal, no rashes or significant lesions EYES: conjunctiva are pink and non-injected, sclera clear LUNGS: clear to auscultation and percussion with normal breathing effort HEART: regular rate & rhythm and no murmurs and no  lower extremity edema Musculoskeletal: no cyanosis of digits and no clubbing  PSYCH: alert & oriented x 3, fluent speech NEURO: no focal motor/sensory deficits  LABORATORY DATA:  I have reviewed the data as listed CBC Latest Ref Rng & Units 08/12/2020 04/09/2020 02/12/2020  WBC 4.0 - 10.5 K/uL 5.5 6.2 5.8  Hemoglobin 12.0 - 15.0 g/dL 11.6(L) 11.5(L) 10.8(L)  Hematocrit 36.0 - 46.0 % 33.8(L) 33.7(L) 32.3(L)  Platelets 150 - 400 K/uL 277 262 271    CMP Latest Ref Rng & Units 08/12/2020 04/09/2020 02/12/2020  Glucose 70 - 99 mg/dL 117(H) 105(H) 130(H)  BUN 8 - 23 mg/dL 24(H) 19 19  Creatinine 0.44 - 1.00 mg/dL 0.91 1.00 1.12(H)  Sodium 135 - 145 mmol/L 139 138 140  Potassium 3.5 - 5.1 mmol/L 4.5 4.1 4.2  Chloride 98 - 111 mmol/L 102 102 103  CO2 22 - 32 mmol/L 27 26 28   Calcium 8.9 - 10.3 mg/dL 9.5 9.8 9.8  Total Protein 6.5 - 8.1 g/dL 7.0 7.1 7.2  Total Bilirubin 0.3 - 1.2 mg/dL 0.4 0.5 0.4  Alkaline Phos 38 - 126 U/L 67 80 89  AST 15 - 41 U/L 22 21 21   ALT 0 - 44 U/L 19 19 14     No results found for: MPROTEIN Lab Results  Component Value Date   KPAFRELGTCHN 13.9 11/08/2019   LAMBDASER 11.1 11/08/2019   KAPLAMBRATIO 1.25 11/08/2019     RADIOGRAPHIC STUDIES: MR Brain Wo Contrast  Result Date: 07/14/2020 CLINICAL DATA:  Vertigo. Dizziness. Acute intractable headache, unspecified headache type. Nausea. Dizziness,  persistent/recurrent, cardiac or vascular cause suspected. Additional history provided by scanning technologist: Patient reports vertigo for 1 month. EXAM: MRI HEAD WITHOUT CONTRAST TECHNIQUE: Multiplanar, multiecho pulse sequences of the brain and surrounding structures were obtained without intravenous contrast. COMPARISON:  Prior head CT examinations 06/03/2017 and earlier. FINDINGS: Brain: Mild cerebral and cerebellar atrophy. Mild multifocal T2/FLAIR hyperintensity within the cerebral white matter and pons is nonspecific, but compatible with chronic small vessel ischemic disease. There is no acute infarct. No evidence of intracranial mass. No chronic intracranial blood products. No extra-axial fluid collection. No midline shift. Vascular: Expected proximal arterial flow voids. Skull and upper cervical spine: No focal marrow lesion. Sinuses/Orbits: Visualized orbits show no acute finding. No significant paranasal sinus disease at the imaged levels. IMPRESSION: No evidence of acute intracranial abnormality. Specifically, there is no evidence of acute or recent subacute infarction. Mild generalized atrophy of the brain and chronic small vessel ischemic disease. Electronically Signed   By: Kellie Simmering DO   On: 07/14/2020 10:13    ASSESSMENT & PLAN HAIDYN KILBURG 75 y.o. female with medical history significant for normocytic anemia who presents for a follow up visit.  On exam today Alexis Price notes that her energy level has improved, however her labs do not show much improvement in the way of her iron stores.  As such I recommended we proceed with IV Feraheme 510 mg q. 7 days x 2 doses in order to help bolster her iron stores and improve her normocytic anemia.     She has had no overt signs or symptoms of bleeding at this time and the etiology is most likely either poor iron absorption or continued leaking from her AVM.  I would recommend that she continue her p.o. iron therapy at this time and after  she has received the IV iron.   On exam today Alexis Price's hemoglobin has been stable with no increase or decrease.Marland Kitchen  Her iron levels appeared robust at our prior visit.  Given this increase in improvement her symptoms I recommend that she follow-up in 3 months time for further evaluation.  Additionally because her hemoglobin has not completely normalized I would recommend we check an H. pylori stool antigen in order to assure that she has cleared the infection.  Alexis Price is voiced understanding of the plan moving forward.  # Normocytic Anemia # Iron Deficiency Anemia  --patient completed IV feraheme 510mg  q 7days x 2 doses in Sept 2021. PO iron therapy had not increased her Hgb and her iron stores have elevated minimally. --continue PO iron daily with a source of vitamin C --patient does note stable energy, but Hgb is only modestly increased (iron levels normalized) --patient had endoscopy earlier this year which revealed non bleeding small bowel AVM, colonic diverticulosis, and gastritis . He also tested H. Pylori positive. Appreciate GI assistance in this case.  --recommend f/u stool H. Pylori to be ordered today --RTC in 3 months time to reassess.   No orders of the defined types were placed in this encounter.   All questions were answered. The patient knows to call the clinic with any problems, questions or concerns.  A total of more than 30 minutes were spent on this encounter and over half of that time was spent on counseling and coordination of care as outlined above.   Ledell Peoples, MD Department of Hematology/Oncology [place] [facility] Phone: [phone] Pager: [phone] Email: [email or web address]  08/12/2020 9:03 AM

## 2020-08-12 ENCOUNTER — Encounter: Payer: Self-pay | Admitting: Hematology and Oncology

## 2020-08-12 ENCOUNTER — Inpatient Hospital Stay: Payer: Medicare Other | Attending: Hematology and Oncology

## 2020-08-12 ENCOUNTER — Other Ambulatory Visit: Payer: Self-pay | Admitting: *Deleted

## 2020-08-12 ENCOUNTER — Inpatient Hospital Stay (HOSPITAL_BASED_OUTPATIENT_CLINIC_OR_DEPARTMENT_OTHER): Payer: Medicare Other | Admitting: Hematology and Oncology

## 2020-08-12 ENCOUNTER — Other Ambulatory Visit: Payer: Self-pay

## 2020-08-12 VITALS — BP 153/62 | HR 65 | Temp 97.8°F | Resp 18 | Ht 62.0 in | Wt 167.2 lb

## 2020-08-12 DIAGNOSIS — A048 Other specified bacterial intestinal infections: Secondary | ICD-10-CM

## 2020-08-12 DIAGNOSIS — Z8249 Family history of ischemic heart disease and other diseases of the circulatory system: Secondary | ICD-10-CM | POA: Insufficient documentation

## 2020-08-12 DIAGNOSIS — Z79899 Other long term (current) drug therapy: Secondary | ICD-10-CM | POA: Insufficient documentation

## 2020-08-12 DIAGNOSIS — K2950 Unspecified chronic gastritis without bleeding: Secondary | ICD-10-CM

## 2020-08-12 DIAGNOSIS — I10 Essential (primary) hypertension: Secondary | ICD-10-CM | POA: Insufficient documentation

## 2020-08-12 DIAGNOSIS — D509 Iron deficiency anemia, unspecified: Secondary | ICD-10-CM | POA: Diagnosis not present

## 2020-08-12 DIAGNOSIS — D500 Iron deficiency anemia secondary to blood loss (chronic): Secondary | ICD-10-CM

## 2020-08-12 LAB — CBC WITH DIFFERENTIAL (CANCER CENTER ONLY)
Abs Immature Granulocytes: 0.05 10*3/uL (ref 0.00–0.07)
Basophils Absolute: 0 10*3/uL (ref 0.0–0.1)
Basophils Relative: 1 %
Eosinophils Absolute: 0.1 10*3/uL (ref 0.0–0.5)
Eosinophils Relative: 2 %
HCT: 33.8 % — ABNORMAL LOW (ref 36.0–46.0)
Hemoglobin: 11.6 g/dL — ABNORMAL LOW (ref 12.0–15.0)
Immature Granulocytes: 1 %
Lymphocytes Relative: 28 %
Lymphs Abs: 1.6 10*3/uL (ref 0.7–4.0)
MCH: 31.9 pg (ref 26.0–34.0)
MCHC: 34.3 g/dL (ref 30.0–36.0)
MCV: 92.9 fL (ref 80.0–100.0)
Monocytes Absolute: 0.5 10*3/uL (ref 0.1–1.0)
Monocytes Relative: 9 %
Neutro Abs: 3.2 10*3/uL (ref 1.7–7.7)
Neutrophils Relative %: 59 %
Platelet Count: 277 10*3/uL (ref 150–400)
RBC: 3.64 MIL/uL — ABNORMAL LOW (ref 3.87–5.11)
RDW: 11.3 % — ABNORMAL LOW (ref 11.5–15.5)
WBC Count: 5.5 10*3/uL (ref 4.0–10.5)
nRBC: 0 % (ref 0.0–0.2)

## 2020-08-12 LAB — CMP (CANCER CENTER ONLY)
ALT: 19 U/L (ref 0–44)
AST: 22 U/L (ref 15–41)
Albumin: 4.2 g/dL (ref 3.5–5.0)
Alkaline Phosphatase: 67 U/L (ref 38–126)
Anion gap: 10 (ref 5–15)
BUN: 24 mg/dL — ABNORMAL HIGH (ref 8–23)
CO2: 27 mmol/L (ref 22–32)
Calcium: 9.5 mg/dL (ref 8.9–10.3)
Chloride: 102 mmol/L (ref 98–111)
Creatinine: 0.91 mg/dL (ref 0.44–1.00)
GFR, Estimated: >60 mL/min (ref >60)
Glucose, Bld: 117 mg/dL — ABNORMAL HIGH (ref 70–99)
Potassium: 4.5 mmol/L (ref 3.5–5.1)
Sodium: 139 mmol/L (ref 135–145)
Total Bilirubin: 0.4 mg/dL (ref 0.3–1.2)
Total Protein: 7 g/dL (ref 6.5–8.1)

## 2020-08-12 LAB — RETIC PANEL
Immature Retic Fract: 11.2 % (ref 2.3–15.9)
RBC.: 3.67 MIL/uL — ABNORMAL LOW (ref 3.87–5.11)
Retic Count, Absolute: 59.5 10*3/uL (ref 19.0–186.0)
Retic Ct Pct: 1.6 % (ref 0.4–3.1)
Reticulocyte Hemoglobin: 37.3 pg (ref >27.9)

## 2020-08-12 LAB — IRON AND TIBC
Iron: 98 ug/dL (ref 41–142)
Saturation Ratios: 32 % (ref 21–57)
TIBC: 309 ug/dL (ref 236–444)
UIBC: 211 ug/dL (ref 120–384)

## 2020-08-12 LAB — FERRITIN: Ferritin: 439 ng/mL — ABNORMAL HIGH (ref 11–307)

## 2020-08-13 ENCOUNTER — Telehealth: Payer: Self-pay | Admitting: Hematology and Oncology

## 2020-08-13 NOTE — Telephone Encounter (Signed)
Scheduled per los. Called and left msg. Mailed printout  °

## 2020-09-22 DIAGNOSIS — H524 Presbyopia: Secondary | ICD-10-CM | POA: Diagnosis not present

## 2020-09-22 DIAGNOSIS — Z961 Presence of intraocular lens: Secondary | ICD-10-CM | POA: Diagnosis not present

## 2020-09-22 DIAGNOSIS — H4720 Unspecified optic atrophy: Secondary | ICD-10-CM | POA: Diagnosis not present

## 2020-10-05 ENCOUNTER — Telehealth: Payer: Self-pay | Admitting: Internal Medicine

## 2020-10-05 ENCOUNTER — Ambulatory Visit: Payer: Medicare Other

## 2020-10-05 NOTE — Telephone Encounter (Signed)
LVM for pt to rtn my call to r/s appt with N[facility] on 10/05/20. Please r/s this appt if pt calls the office.

## 2020-10-06 ENCOUNTER — Other Ambulatory Visit: Payer: Self-pay | Admitting: Internal Medicine

## 2020-10-06 NOTE — Telephone Encounter (Signed)
Please refill as per office routine med refill policy (all routine meds refilled for 3 mo or monthly per pt preference up to one year from last visit, then month to month grace period for 3 mo, then further med refills will have to be denied)  

## 2020-11-03 ENCOUNTER — Ambulatory Visit (INDEPENDENT_AMBULATORY_CARE_PROVIDER_SITE_OTHER): Payer: Medicare Other

## 2020-11-03 ENCOUNTER — Other Ambulatory Visit: Payer: Self-pay

## 2020-11-03 VITALS — BP 140/70 | HR 97 | Temp 98.1°F | Ht 62.0 in | Wt 168.0 lb

## 2020-11-03 DIAGNOSIS — Z0000 Encounter for general adult medical examination without abnormal findings: Secondary | ICD-10-CM | POA: Diagnosis not present

## 2020-11-03 NOTE — Progress Notes (Signed)
Subjective:   Alexis Price is a 75 y.o. female who presents for Medicare Annual (Subsequent) preventive examination.  Review of Systems    No ROS. Medicare Wellness Visit. Additional risk factors are reflected in social history. Cardiac Risk Factors include: advanced age (>33men, >67 women);dyslipidemia;family history of premature cardiovascular disease;hypertension;obesity (BMI >30kg/m2) Sleep Patterns: No sleep issues, feels rested on waking and sleeps 6-7 hours nightly. Home Safety/Smoke Alarms: Feels safe in home; uses home alarm. Smoke alarms in place. Living environment: 2-story home; Lives with spouse; also has a beach home; no needs for DME; good support system. Seat Belt Safety/Bike Helmet: Wears seat belt.    Objective:    Today's Vitals   11/03/20 1528  BP: 140/70  Pulse: 97  Temp: 98.1 F (36.7 C)  SpO2: 94%  Weight: 168 lb (76.2 kg)  Height: 5\' 2"  (1.575 m)  PainSc: 0-No pain   Body mass index is 30.73 kg/m.  Advanced Directives 11/03/2020 02/27/2020 02/20/2020  Does Patient Have a Medical Advance Directive? No No No  Would patient like information on creating a medical advance directive? No - Patient declined No - Patient declined No - Guardian declined    Current Medications (verified) Outpatient Encounter Medications as of 11/03/2020  Medication Sig  . lisinopril-hydrochlorothiazide (ZESTORETIC) 20-12.5 MG tablet Take 2 tablets by mouth once daily  . atorvastatin (LIPITOR) 20 MG tablet Take 1 tablet by mouth once daily  . iron polysaccharides (NU-IRON) 150 MG capsule Take 1 capsule (150 mg total) by mouth daily. (Patient not taking: No sig reported)  . Multiple Vitamin (MULTIVITAMIN) tablet Take 1 tablet by mouth daily. Takes Nature's Bounty Vit D 3 Daily  . [DISCONTINUED] amitriptyline (ELAVIL) 50 MG tablet TAKE 1 TABLET BY MOUTH AT BEDTIME  . [DISCONTINUED] diazepam (VALIUM) 5 MG tablet 1 tab by mouth per day x 3 days  . [DISCONTINUED] meclizine  (ANTIVERT) 25 MG tablet Take 1 tablet (25 mg total) by mouth 3 (three) times daily as needed for dizziness.   No facility-administered encounter medications on file as of 11/03/2020.    Allergies (verified) Patient has no allergy information on record.   History: Past Medical History:  Diagnosis Date  . Anemia   . Arthritis   . Cancer ([place])   . Diverticulosis   . Essential hypertension 08/24/2007   Qualifier: Diagnosis of  By: Tiney Rouge CMA, Ellison Hughs    . HYPERLIPIDEMIA   . Hyperlipidemia 08/24/2007   Qualifier: Diagnosis of  By: Tiney Rouge CMA, Ellison Hughs   medicaation - simvastatin 20 mg  On for > 1 year with no adverse side affects   . HYPERTENSION   . Postmenopausal atrophic vaginitis 05/12/2011  . Temporal arteritis ([place]) 06/20/2017   Past Surgical History:  Procedure Laterality Date  . COLON SURGERY    . Emergent colectomy with creation of colosomy    . flex sigmoidoscopy  1998  . [place] of colostomy with salpingectomy and lysis of adhesion  04/2005  . TONSILLECTOMY     age 56  . TUBAL LIGATION  1976   Family History  Problem Relation Age of Onset  . Hypertension Mother   . Heart disease Mother   . Coronary artery disease Mother        valve repalcements   Social History   Socioeconomic History  . Marital status: Married    Spouse name: Not on file  . Number of children: 2  . Years of education: 49  . Highest education level: Not on file  Occupational History  . Occupation: Glass blower/designer  Tobacco Use  . Smoking status: Never Smoker  . Smokeless tobacco: Never Used  Vaping Use  . Vaping Use: Never used  Substance and Sexual Activity  . Alcohol use: Yes    Comment: 3-4 times per week  . Drug use: No  . Sexual activity: Yes    Partners: Male  Other Topics Concern  . 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 2 sons ' '69, '77. 4 grand-daughters. 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    Denies abuse and feels safe at home.     Social Determinants of Health   Financial Resource Strain: Low Risk   . Difficulty of Paying Living Expenses: Not hard at all  Food Insecurity: No Food Insecurity  . Worried About Charity fundraiser in the Last Year: Never true  . Ran Out of Food in the Last Year: Never true  Transportation Needs: No Transportation Needs  . Lack of Transportation (Medical): No  . Lack of Transportation (Non-Medical): No  Physical Activity: Insufficiently Active  . Days of Exercise per Week: 4 days  . Minutes of Exercise per Session: 30 min  Stress: No Stress Concern Present  . Feeling of Stress : Not at all  Social Connections: Socially Integrated  . Frequency of Communication with Friends and Family: More than three times a week  . Frequency of Social Gatherings with Friends and Family: More than three times a week  . Attends Religious Services: More than 4 times per year  . Active Member of Clubs or Organizations: No  . Attends Archivist Meetings: More than 4 times per year  . Marital Status: Married    Tobacco Counseling Counseling given: Not Answered   Clinical Intake:  Pre-visit preparation completed: Yes  Pain : No/denies pain Pain Score: 0-No pain     BMI - recorded: 30.73 Nutritional Status: BMI > 30  Obese Nutritional Risks: None Diabetes: No  How often do you need to have someone help you when you read instructions, pamphlets, or other written materials from your doctor or pharmacy?: 1 - Never What is the last grade level you completed in school?: High School Graduate; College ([employer])  Diabetic? no  Interpreter Needed?: No  Information entered by :: Lisette Abu, LPN   Activities of Daily Living In your present state of health, do you have any difficulty performing the following activities: 11/03/2020  Hearing? N  Vision? N  Difficulty concentrating or making decisions? N  Walking or climbing stairs? N  Dressing or bathing? N  Doing errands,  shopping? N  Preparing Food and eating ? N  Using the Toilet? N  In the past six months, have you accidently leaked urine? N  Do you have problems with loss of bowel control? N  Managing your Medications? N  Managing your Finances? N  Housekeeping or managing your Housekeeping? N  Some recent data might be hidden    Patient Care Team: Biagio Borg, MD as PCP - General (Internal Medicine) Sharyne Peach, MD (Ophthalmology)  Indicate any recent Medical Services you may have received from other than [facility] providers in the past year (date may be approximate).     Assessment:   This is a routine wellness examination for Kayton.  Hearing/Vision screen No exam data present  Dietary issues and exercise activities discussed: Current Exercise Habits: Home exercise routine, Type of exercise: walking, Time (Minutes):  30, Frequency (Times/Week): 4, Weekly Exercise (Minutes/Week): 120, Intensity: Mild, Exercise limited by: orthopedic condition(s)  Goals Addressed   None    Depression Screen PHQ 2/9 Scores 11/03/2020 08/23/2019 08/22/2018 05/16/2017  PHQ - 2 Score 0 0 0 0    Fall Risk Fall Risk  11/03/2020 08/23/2019 08/22/2018 04/13/2018 05/16/2017  Falls in the past year? 0 0 0 0 No  Comment - - - [place] Telephone Survey: data to providers prior to load -  Number falls in past yr: 0 - - - -  Injury with Fall? 0 - - - -  Risk for fall due to : No Fall Risks - - - -  Follow up Falls evaluation completed - - - -    FALL RISK PREVENTION PERTAINING TO THE HOME:  Any stairs in or around the home? Yes  If so, are there any without handrails? No  Home free of loose throw rugs in walkways, pet beds, electrical cords, etc? Yes  Adequate lighting in your home to reduce risk of falls? Yes   ASSISTIVE DEVICES UTILIZED TO PREVENT FALLS:  Life alert? No  Use of a cane, walker or w/c? No  Grab bars in the bathroom? No  Shower chair or bench in shower? No  Elevated toilet seat or a handicapped  toilet? No   TIMED UP AND GO:  Was the test performed? No .  Length of time to ambulate 10 feet: 0 sec.   Gait steady and fast without use of assistive device  Cognitive Function: Normal cognitive status assessed by direct observation by this Nurse Health Advisor. No abnormalities found.          Immunizations Immunization History  Administered Date(s) Administered  . Fluad Quad(high Dose 65+) 02/20/2020  . Influenza Split 05/11/2011  . Influenza, High Dose Seasonal PF 03/06/2013  . Influenza, Seasonal, Injecte, Preservative Fre 06/26/2012  . PFIZER(Purple Top)SARS-COV-2 Vaccination 08/22/2019, 09/12/2019  . Pneumococcal Conjugate-13 09/20/2016  . Pneumococcal Polysaccharide-23 08/22/2018  . Tetanus 06/26/2012    TDAP status: Up to date  Flu Vaccine status: Up to date  Pneumococcal vaccine status: Up to date  Covid-19 vaccine status: Completed vaccines  Qualifies for Shingles Vaccine? Yes   Zostavax completed No   Shingrix Completed?: No.    Education has been provided regarding the importance of this vaccine. Patient has been advised to call insurance company to determine out of pocket expense if they have not yet received this vaccine. Advised may also receive vaccine at local pharmacy or Health Dept. Verbalized acceptance and understanding.  Screening Tests Health Maintenance  Topic Date Due  . Zoster Vaccines- Shingrix (1 of 2) Never done  . DEXA SCAN  Never done  . COVID-19 Vaccine (3 - Booster for Pfizer series) 02/12/2020  . INFLUENZA VACCINE  12/28/2020  . TETANUS/TDAP  06/26/2022  . COLONOSCOPY (Pts 45-39yrs Insurance coverage will need to be confirmed)  10/31/2029  . Hepatitis C Screening  Completed  . PNA vac Low Risk Adult  Completed  . Pneumococcal Vaccine 28-16 Years old  Aged Out  . HPV VACCINES  Aged Out    Health Maintenance  Health Maintenance Due  Topic Date Due  . Zoster Vaccines- Shingrix (1 of 2) Never done  . DEXA SCAN  Never done  .  COVID-19 Vaccine (3 - Booster for Pfizer series) 02/12/2020    Colorectal cancer screening: Type of screening: Colonoscopy. Completed 11/01/2019. Repeat every 10 years  Mammogram status: Completed 10/192016. Repeat every year  Bone  Density Status: never done  Lung Cancer Screening: (Low Dose CT Chest recommended if Age 37-80 years, 30 pack-year currently smoking OR have quit w/in 15years.) does not qualify.   Lung Cancer Screening Referral: no  Additional Screening:  Hepatitis C Screening: does qualify; Completed yes  Vision Screening: Recommended annual ophthalmology exams for early detection of glaucoma and other disorders of the eye. Is the patient up to date with their annual eye exam?  Yes  Who is the provider or what is the name of the office in which the patient attends annual eye exams? Sharyne Peach, MD. If pt is not established with a provider, would they like to be referred to a provider to establish care? No .   Dental Screening: Recommended annual dental exams for proper oral hygiene  Community Resource Referral / Chronic Care Management: CRR required this visit?  No   CCM required this visit?  No      Plan:     I have personally reviewed and noted the following in the patient's chart:   . Medical and social history . Use of alcohol, tobacco or illicit drugs  . Current medications and supplements including opioid prescriptions.  . Functional ability and status . Nutritional status . Physical activity . Advanced directives . List of other physicians . Hospitalizations, surgeries, and ER visits in previous 12 months . Vitals . Screenings to include cognitive, depression, and falls . Referrals and appointments  In addition, I have reviewed and discussed with patient certain preventive protocols, quality metrics, and best practice recommendations. A written personalized care plan for preventive services as well as general preventive health recommendations were  provided to patient.     Sheral Flow, LPN   12/06/4799   Nurse Notes:  Medications reviewed with patient; no opioid use noted.

## 2020-11-03 NOTE — Patient Instructions (Signed)
Alexis Price , Thank you for taking time to come for your Medicare Wellness Visit. I appreciate your ongoing commitment to your health goals. Please review the following plan we discussed and let me know if I can assist you in the future.   Screening recommendations/referrals: Colonoscopy: 69/08/2019; due every 10 years Mammogram: last done 03/18/2015 Bone Density: never done Recommended yearly ophthalmology/optometry visit for glaucoma screening and checkup Recommended yearly dental visit for hygiene and checkup  Vaccinations: Influenza vaccine: 02/20/2020 Pneumococcal vaccine: 09/20/2016, 08/22/2018 Tdap vaccine: 06/26/2012; due every 10 years Shingles vaccine: never done; can check with local pharmacy for vaccine. Covid-19: 08/22/2019, 09/12/2019  Advanced directives: Advance directive discussed with you today. Even though you declined this today please call our office should you change your mind and we can give you the proper paperwork for you to fill out.  Conditions/risks identified: Yes; Reviewed health maintenance screenings with patient today and relevant education, vaccines, and/or referrals were provided. Please continue to do your personal lifestyle choices by: daily care of teeth and gums, regular physical activity (goal should be 5 days a week for 30 minutes), eat a healthy diet, avoid tobacco and drug use, limiting any alcohol intake, taking a low-dose aspirin (if not allergic or have been advised by your provider otherwise) and taking vitamins and minerals as recommended by your provider. Continue doing brain stimulating activities (puzzles, reading, adult coloring books, staying active) to keep memory sharp. Continue to eat heart healthy diet (full of fruits, vegetables, whole grains, lean protein, water--limit salt, fat, and sugar intake) and increase physical activity as tolerated.  Next appointment: Please schedule your next Medicare Wellness Visit with your Nurse Health Advisor in  1 year by calling [phone].   Preventive Care 75 Years and Older, Female Preventive care refers to lifestyle choices and visits with your health care provider that can promote health and wellness. What does preventive care include?  A yearly physical exam. This is also called an annual well check.  Dental exams once or twice a year.  Routine eye exams. Ask your health care provider how often you should have your eyes checked.  Personal lifestyle choices, including:  Daily care of your teeth and gums.  Regular physical activity.  Eating a healthy diet.  Avoiding tobacco and drug use.  Limiting alcohol use.  Practicing safe sex.  Taking low-dose aspirin every day.  Taking vitamin and mineral supplements as recommended by your health care provider. What happens during an annual well check? The services and screenings done by your health care provider during your annual well check will depend on your age, overall health, lifestyle risk factors, and family history of disease. Counseling  Your health care provider may ask you questions about your:  Alcohol use.  Tobacco use.  Drug use.  Emotional well-being.  Home and relationship well-being.  Sexual activity.  Eating habits.  History of falls.  Memory and ability to understand (cognition).  Work and work Statistician.  Reproductive health. Screening  You may have the following tests or measurements:  Height, weight, and BMI.  Blood pressure.  Lipid and cholesterol levels. These may be checked every 5 years, or more frequently if you are over 34 years old.  Skin check.  Lung cancer screening. You may have this screening every year starting at age 20 if you have a 30-pack-year history of smoking and currently smoke or have quit within the past 15 years.  Fecal occult blood test (FOBT) of the stool. You may have  this test every year starting at age 52.  Flexible sigmoidoscopy or colonoscopy. You may  have a sigmoidoscopy every 5 years or a colonoscopy every 10 years starting at age 77.  Hepatitis C blood test.  Hepatitis B blood test.  Sexually transmitted disease (STD) testing.  Diabetes screening. This is done by checking your blood sugar (glucose) after you have not eaten for a while (fasting). You may have this done every 1-3 years.  Bone density scan. This is done to screen for osteoporosis. You may have this done starting at age 2.  Mammogram. This may be done every 1-2 years. Talk to your health care provider about how often you should have regular mammograms. Talk with your health care provider about your test results, treatment options, and if necessary, the need for more tests. Vaccines  Your health care provider may recommend certain vaccines, such as:  Influenza vaccine. This is recommended every year.  Tetanus, diphtheria, and acellular pertussis (Tdap, Td) vaccine. You may need a Td booster every 10 years.  Zoster vaccine. You may need this after age 27.  Pneumococcal 13-valent conjugate (PCV13) vaccine. One dose is recommended after age 57.  Pneumococcal polysaccharide (PPSV23) vaccine. One dose is recommended after age 5. Talk to your health care provider about which screenings and vaccines you need and how often you need them. This information is not intended to replace advice given to you by your health care provider. Make sure you discuss any questions you have with your health care provider. Document Released: 06/12/2015 Document Revised: 02/03/2016 Document Reviewed: 03/17/2015 Elsevier Interactive Patient Education  2017 [place] Prevention in the Home Falls can cause injuries. They can happen to people of all ages. There are many things you can do to make your home safe and to help prevent falls. What can I do on the outside of my home?  Regularly fix the edges of walkways and driveways and fix any cracks.  Remove anything that might make  you trip as you walk through a door, such as a raised step or threshold.  Trim any bushes or trees on the path to your home.  Use bright outdoor lighting.  Clear any walking paths of anything that might make someone trip, such as rocks or tools.  Regularly check to see if handrails are loose or broken. Make sure that both sides of any steps have handrails.  Any raised decks and porches should have guardrails on the edges.  Have any leaves, snow, or ice cleared regularly.  Use sand or salt on walking paths during winter.  Clean up any spills in your garage right away. This includes oil or grease spills. What can I do in the bathroom?  Use night lights.  Install grab bars by the toilet and in the tub and shower. Do not use towel bars as grab bars.  Use non-skid mats or decals in the tub or shower.  If you need to sit down in the shower, use a plastic, non-slip stool.  Keep the floor dry. Clean up any water that spills on the floor as soon as it happens.  Remove soap buildup in the tub or shower regularly.  Attach bath mats securely with double-sided non-slip rug tape.  Do not have throw rugs and other things on the floor that can make you trip. What can I do in the bedroom?  Use night lights.  Make sure that you have a light by your bed that is easy to  reach.  Do not use any sheets or blankets that are too big for your bed. They should not hang down onto the floor.  Have a firm chair that has side arms. You can use this for support while you get dressed.  Do not have throw rugs and other things on the floor that can make you trip. What can I do in the kitchen?  Clean up any spills right away.  Avoid walking on wet floors.  Keep items that you use a lot in easy-to-reach places.  If you need to reach something above you, use a strong step stool that has a grab bar.  Keep electrical cords out of the way.  Do not use floor polish or wax that makes floors slippery.  If you must use wax, use non-skid floor wax.  Do not have throw rugs and other things on the floor that can make you trip. What can I do with my stairs?  Do not leave any items on the stairs.  Make sure that there are handrails on both sides of the stairs and use them. Fix handrails that are broken or loose. Make sure that handrails are as long as the stairways.  Check any carpeting to make sure that it is firmly attached to the stairs. Fix any carpet that is loose or worn.  Avoid having throw rugs at the top or bottom of the stairs. If you do have throw rugs, attach them to the floor with carpet tape.  Make sure that you have a light switch at the top of the stairs and the bottom of the stairs. If you do not have them, ask someone to add them for you. What else can I do to help prevent falls?  Wear shoes that:  Do not have high heels.  Have rubber bottoms.  Are comfortable and fit you well.  Are closed at the toe. Do not wear sandals.  If you use a stepladder:  Make sure that it is fully opened. Do not climb a closed stepladder.  Make sure that both sides of the stepladder are locked into place.  Ask someone to hold it for you, if possible.  Clearly mark and make sure that you can see:  Any grab bars or handrails.  First and last steps.  Where the edge of each step is.  Use tools that help you move around (mobility aids) if they are needed. These include:  Canes.  Walkers.  Scooters.  Crutches.  Turn on the lights when you go into a dark area. Replace any light bulbs as soon as they burn out.  Set up your furniture so you have a clear path. Avoid moving your furniture around.  If any of your floors are uneven, fix them.  If there are any pets around you, be aware of where they are.  Review your medicines with your doctor. Some medicines can make you feel dizzy. This can increase your chance of falling. Ask your doctor what other things that you can do to  help prevent falls. This information is not intended to replace advice given to you by your health care provider. Make sure you discuss any questions you have with your health care provider. Document Released: 03/12/2009 Document Revised: 10/22/2015 Document Reviewed: 06/20/2014 Elsevier Interactive Patient Education  2017 [employer].

## 2020-11-05 ENCOUNTER — Other Ambulatory Visit: Payer: Self-pay | Admitting: Internal Medicine

## 2020-11-05 DIAGNOSIS — E782 Mixed hyperlipidemia: Secondary | ICD-10-CM

## 2020-11-10 ENCOUNTER — Telehealth: Payer: Self-pay | Admitting: Hematology and Oncology

## 2020-11-10 NOTE — Telephone Encounter (Signed)
R/s 6/16 appt due to provider on call schedule. Called and left msg with new date and time

## 2020-11-12 ENCOUNTER — Telehealth: Payer: Self-pay | Admitting: Hematology and Oncology

## 2020-11-12 ENCOUNTER — Inpatient Hospital Stay: Payer: Medicare Other | Admitting: Hematology and Oncology

## 2020-11-12 ENCOUNTER — Inpatient Hospital Stay: Payer: Medicare Other

## 2020-11-12 NOTE — Telephone Encounter (Signed)
Called pt to r/s appt per 6/15 sch msg. No answer. Left msg for pt to call back to r/s.

## 2020-11-20 ENCOUNTER — Inpatient Hospital Stay: Payer: Medicare Other

## 2020-11-20 ENCOUNTER — Inpatient Hospital Stay: Payer: Medicare Other | Admitting: Hematology and Oncology

## 2020-12-04 ENCOUNTER — Inpatient Hospital Stay: Payer: Medicare Other | Attending: Hematology and Oncology

## 2020-12-04 ENCOUNTER — Inpatient Hospital Stay: Payer: Medicare Other | Admitting: Hematology and Oncology

## 2020-12-04 ENCOUNTER — Other Ambulatory Visit: Payer: Self-pay | Admitting: Hematology and Oncology

## 2020-12-04 DIAGNOSIS — D500 Iron deficiency anemia secondary to blood loss (chronic): Secondary | ICD-10-CM

## 2021-01-18 ENCOUNTER — Other Ambulatory Visit: Payer: Self-pay | Admitting: Internal Medicine

## 2021-01-18 DIAGNOSIS — E782 Mixed hyperlipidemia: Secondary | ICD-10-CM

## 2021-01-18 NOTE — Telephone Encounter (Signed)
Please refill as per office routine med refill policy (all routine meds refilled for 3 mo or monthly per pt preference up to one year from last visit, then month to month grace period for 3 mo, then further med refills will have to be denied)  

## 2021-01-21 ENCOUNTER — Other Ambulatory Visit: Payer: Self-pay | Admitting: Internal Medicine

## 2021-01-21 DIAGNOSIS — E782 Mixed hyperlipidemia: Secondary | ICD-10-CM

## 2021-01-21 NOTE — Telephone Encounter (Signed)
Please refill as per office routine med refill policy (all routine meds refilled for 3 mo or monthly per pt preference up to one year from last visit, then month to month grace period for 3 mo, then further med refills will have to be denied)  

## 2021-01-21 NOTE — Telephone Encounter (Signed)
Please update pharnacy:  [facility] [place], [place]  Phone:  [phone] Fax:  [phone]  Patient has moved & would like for current & future refills to go to this location

## 2021-02-06 DIAGNOSIS — R059 Cough, unspecified: Secondary | ICD-10-CM | POA: Diagnosis not present

## 2021-02-06 DIAGNOSIS — R519 Headache, unspecified: Secondary | ICD-10-CM | POA: Diagnosis not present

## 2021-02-06 DIAGNOSIS — U071 COVID-19: Secondary | ICD-10-CM | POA: Diagnosis not present

## 2021-04-30 ENCOUNTER — Other Ambulatory Visit: Payer: Self-pay

## 2021-04-30 ENCOUNTER — Telehealth (INDEPENDENT_AMBULATORY_CARE_PROVIDER_SITE_OTHER): Payer: Medicare Other | Admitting: Internal Medicine

## 2021-04-30 DIAGNOSIS — I10 Essential (primary) hypertension: Secondary | ICD-10-CM

## 2021-04-30 DIAGNOSIS — E559 Vitamin D deficiency, unspecified: Secondary | ICD-10-CM | POA: Diagnosis not present

## 2021-04-30 DIAGNOSIS — E782 Mixed hyperlipidemia: Secondary | ICD-10-CM

## 2021-04-30 DIAGNOSIS — E538 Deficiency of other specified B group vitamins: Secondary | ICD-10-CM

## 2021-04-30 DIAGNOSIS — I2583 Coronary atherosclerosis due to lipid rich plaque: Secondary | ICD-10-CM

## 2021-04-30 DIAGNOSIS — R739 Hyperglycemia, unspecified: Secondary | ICD-10-CM | POA: Diagnosis not present

## 2021-04-30 DIAGNOSIS — I2510 Atherosclerotic heart disease of native coronary artery without angina pectoris: Secondary | ICD-10-CM

## 2021-04-30 DIAGNOSIS — D509 Iron deficiency anemia, unspecified: Secondary | ICD-10-CM

## 2021-04-30 MED ORDER — LISINOPRIL-HYDROCHLOROTHIAZIDE 20-12.5 MG PO TABS: 2.0000 | ORAL_TABLET | Freq: Every day | ORAL | 3 refills | Status: DC

## 2021-04-30 MED ORDER — ATORVASTATIN CALCIUM 20 MG PO TABS: 20.0000 mg | ORAL_TABLET | Freq: Every day | ORAL | 3 refills | Status: DC

## 2021-04-30 NOTE — Progress Notes (Signed)
Patient ID: Alexis Price, female   DOB: [birth year], 75 y.o.   MRN: [id number]  Virtual Visit via Video Note  I connected with Alexis Price on 05/09/21 at 10:40 AM EST by a video enabled telemedicine application and verified that I am speaking with the correct person using two identifiers.  Location of all participants today Patient: at home Provider: at office   I discussed the limitations of evaluation and management by telemedicine and the availability of in person appointments. The patient expressed understanding and agreed to proceed.  History of Present Illness: Here to f/u, overall doing well   Pt denies chest pain, increased sob or doe, wheezing, orthopnea, PND, increased LE swelling, palpitations, dizziness or syncope.   Pt denies polydipsia, polyuria, or new focal neuro s/s.   Pt denies fever, wt loss, night sweats, loss of appetite, or other constitutional symptoms Mentions very recent deaths of 2 brothers and 1 sister all with heart disease.   Living at [facility] now, but has son in [place], travels often.  Willing for labs and cardiac CT score    Pt denies fever, wt loss, night sweats, loss of appetite, or other constitutional symptoms   Denies overt bleeding recently given hx of iron def anemia.  Past Medical History:  Diagnosis Date   Anemia    Arthritis    Cancer ([place])    Diverticulosis    Essential hypertension 08/24/2007   Qualifier: Diagnosis of  By: Tiney Rouge CMA, [place]    Hyperlipidemia 08/24/2007   Qualifier: Diagnosis of  By: Tiney Rouge CMA, Ellison Hughs   medicaation - simvastatin 20 mg  On for > 1 year with no adverse side affects    HYPERTENSION    Postmenopausal atrophic vaginitis 05/12/2011   Temporal arteritis ([place]) 06/20/2017   Past Surgical History:  Procedure Laterality Date   COLON SURGERY     Emergent colectomy with creation of colosomy     flex sigmoidoscopy  1998   [place] of colostomy with salpingectomy and lysis of adhesion   04/2005   TONSILLECTOMY     age 89   [place]    reports that she has never smoked. She has never used smokeless tobacco. She reports current alcohol use. She reports that she does not use drugs. family history includes Coronary artery disease in her mother; Heart disease in her mother; Hypertension in her mother. Not on File Current Outpatient Medications on File Prior to Visit  Medication Sig Dispense Refill   iron polysaccharides (NU-IRON) 150 MG capsule Take 1 capsule (150 mg total) by mouth daily. (Patient not taking: No sig reported) 90 capsule 1   Multiple Vitamin (MULTIVITAMIN) tablet Take 1 tablet by mouth daily. Takes Nature's Bounty Vit D 3 Daily     No current facility-administered medications on file prior to visit.   Observations/Objective: Alert, NAD, appropriate mood and affect, resps normal, cn 2-12 intact, moves all 4s, no visible rash or swelling Lab Results  Component Value Date   WBC 5.5 05/04/2021   HGB 12.2 05/04/2021   HCT 36.0 05/04/2021   PLT 270.0 05/04/2021   GLUCOSE 100 (H) 05/04/2021   CHOL 235 (H) 05/04/2021   TRIG 207.0 (H) 05/04/2021   HDL 66.10 05/04/2021   LDLDIRECT 140.0 05/04/2021   LDLCALC 110 (H) 08/23/2019   ALT 18 05/04/2021   AST 20 05/04/2021   NA 137 05/04/2021   K 4.1 05/04/2021   CL 101 05/04/2021  CREATININE 0.82 05/04/2021   BUN 21 05/04/2021   CO2 25 05/04/2021   TSH 2.03 05/04/2021   HGBA1C 5.9 05/04/2021   Assessment and Plan: See notes  Follow Up Instructions: See notes   I discussed the assessment and treatment plan with the patient. The patient was provided an opportunity to ask questions and all were answered. The patient agreed with the plan and demonstrated an understanding of the instructions.   The patient was advised to call back or seek an in-person evaluation if the symptoms worsen or if the condition fails to improve as anticipated.   Cathlean Cower, MD

## 2021-04-30 NOTE — Patient Instructions (Addendum)
We have discussed the Cardiac CT Score test to measure the calcification level (if any) in your heart arteries.  This test has been ordered in our [place], so please call [facility] directly, as they prefer this, at [phone] to be scheduled.  Please continue all other medications as before, and refills have been done if requested.  Please have the pharmacy call with any other refills you may need.  Please continue your efforts at being more active, low cholesterol diet, and weight control.  You are otherwise up to date with prevention measures today.  Please keep your appointments with your specialists as you may have planned  Please go to the LAB at the blood drawing area for the tests to be done - at the [facility] lab next Tuesday as you mentioned  You will be contacted by phone if any changes need to be made immediately.  Otherwise, you will receive a letter about your results with an explanation, but please check with MyChart first.  Please remember to sign up for MyChart if you have not done so, as this will be important to you in the future with finding out test results, communicating by private email, and scheduling acute appointments online when needed.  Please make an Appointment to return in 6 months, or sooner if needed

## 2021-05-04 ENCOUNTER — Other Ambulatory Visit (INDEPENDENT_AMBULATORY_CARE_PROVIDER_SITE_OTHER): Payer: Medicare Other

## 2021-05-04 ENCOUNTER — Encounter: Payer: Self-pay | Admitting: Internal Medicine

## 2021-05-04 DIAGNOSIS — E559 Vitamin D deficiency, unspecified: Secondary | ICD-10-CM | POA: Diagnosis not present

## 2021-05-04 DIAGNOSIS — D509 Iron deficiency anemia, unspecified: Secondary | ICD-10-CM

## 2021-05-04 DIAGNOSIS — E782 Mixed hyperlipidemia: Secondary | ICD-10-CM

## 2021-05-04 DIAGNOSIS — R739 Hyperglycemia, unspecified: Secondary | ICD-10-CM | POA: Diagnosis not present

## 2021-05-04 DIAGNOSIS — E538 Deficiency of other specified B group vitamins: Secondary | ICD-10-CM | POA: Diagnosis not present

## 2021-05-04 LAB — HEPATIC FUNCTION PANEL
ALT: 18 U/L (ref 0–35)
AST: 20 U/L (ref 0–37)
Albumin: 4.7 g/dL (ref 3.5–5.2)
Alkaline Phosphatase: 66 U/L (ref 39–117)
Bilirubin, Direct: 0.1 mg/dL (ref 0.0–0.3)
Total Bilirubin: 0.5 mg/dL (ref 0.2–1.2)
Total Protein: 7.3 g/dL (ref 6.0–8.3)

## 2021-05-04 LAB — CBC WITH DIFFERENTIAL/PLATELET
Basophils Absolute: 0 10*3/uL (ref 0.0–0.1)
Basophils Relative: 0.7 % (ref 0.0–3.0)
Eosinophils Absolute: 0.1 10*3/uL (ref 0.0–0.7)
Eosinophils Relative: 1.6 % (ref 0.0–5.0)
HCT: 36 % (ref 36.0–46.0)
Hemoglobin: 12.2 g/dL (ref 12.0–15.0)
Lymphocytes Relative: 30.3 % (ref 12.0–46.0)
Lymphs Abs: 1.7 10*3/uL (ref 0.7–4.0)
MCHC: 34 g/dL (ref 30.0–36.0)
MCV: 92.6 fl (ref 78.0–100.0)
Monocytes Absolute: 0.5 10*3/uL (ref 0.1–1.0)
Monocytes Relative: 8.3 % (ref 3.0–12.0)
Neutro Abs: 3.2 10*3/uL (ref 1.4–7.7)
Neutrophils Relative %: 59.1 % (ref 43.0–77.0)
Platelets: 270 10*3/uL (ref 150.0–400.0)
RBC: 3.89 Mil/uL (ref 3.87–5.11)
RDW: 12.4 % (ref 11.5–15.5)
WBC: 5.5 10*3/uL (ref 4.0–10.5)

## 2021-05-04 LAB — BASIC METABOLIC PANEL
BUN: 21 mg/dL (ref 6–23)
CO2: 25 mEq/L (ref 19–32)
Calcium: 10.1 mg/dL (ref 8.4–10.5)
Chloride: 101 mEq/L (ref 96–112)
Creatinine, Ser: 0.82 mg/dL (ref 0.40–1.20)
GFR: 69.82 mL/min (ref >60.00)
Glucose, Bld: 100 mg/dL — ABNORMAL HIGH (ref 70–99)
Potassium: 4.1 mEq/L (ref 3.5–5.1)
Sodium: 137 mEq/L (ref 135–145)

## 2021-05-04 LAB — HEMOGLOBIN A1C: Hgb A1c MFr Bld: 5.9 % (ref 4.6–6.5)

## 2021-05-04 LAB — LIPID PANEL
Cholesterol: 235 mg/dL — ABNORMAL HIGH (ref 0–200)
HDL: 66.1 mg/dL (ref >39.00)
NonHDL: 169.39
Total CHOL/HDL Ratio: 4
Triglycerides: 207 mg/dL — ABNORMAL HIGH (ref 0.0–149.0)
VLDL: 41.4 mg/dL — ABNORMAL HIGH (ref 0.0–40.0)

## 2021-05-04 LAB — URINALYSIS, ROUTINE W REFLEX MICROSCOPIC
Bilirubin Urine: NEGATIVE
Hgb urine dipstick: NEGATIVE
Ketones, ur: NEGATIVE
Leukocytes,Ua: NEGATIVE
Nitrite: NEGATIVE
RBC / HPF: NONE SEEN (ref 0–?)
Specific Gravity, Urine: 1.01 (ref 1.000–1.030)
Total Protein, Urine: NEGATIVE
Urine Glucose: NEGATIVE
Urobilinogen, UA: 0.2 (ref 0.0–1.0)
pH: 6 (ref 5.0–8.0)

## 2021-05-04 LAB — VITAMIN B12: Vitamin B-12: 204 pg/mL — ABNORMAL LOW (ref 211–911)

## 2021-05-04 LAB — FERRITIN: Ferritin: 311 ng/mL — ABNORMAL HIGH (ref 10.0–291.0)

## 2021-05-04 LAB — VITAMIN D 25 HYDROXY (VIT D DEFICIENCY, FRACTURES): VITD: 52.53 ng/mL (ref 30.00–100.00)

## 2021-05-04 LAB — IBC PANEL
Iron: 113 ug/dL (ref 42–145)
Saturation Ratios: 30.8 % (ref 20.0–50.0)
TIBC: 366.8 ug/dL (ref 250.0–450.0)
Transferrin: 262 mg/dL (ref 212.0–360.0)

## 2021-05-04 LAB — LDL CHOLESTEROL, DIRECT: Direct LDL: 140 mg/dL

## 2021-05-04 LAB — TSH: TSH: 2.03 u[IU]/mL (ref 0.35–5.50)

## 2021-05-09 NOTE — Assessment & Plan Note (Signed)
Lab Results  Component Value Date   LDLCALC 110 (H) 08/23/2019   Uncontrolled, goal ldl < 70, pt to continue current statin lipitor 20 but f/u lipid panel with labs and Cardiac Ct score

## 2021-05-09 NOTE — Assessment & Plan Note (Signed)
Lab Results  Component Value Date   HGBA1C 5.9 05/04/2021   Stable, pt to continue current medical treatment  - diet

## 2021-05-09 NOTE — Assessment & Plan Note (Addendum)
Also for f/u lab with cbc and iron, but likely continue iron due to chronic GI blood loss

## 2021-05-09 NOTE — Assessment & Plan Note (Signed)
Last vitamin D Lab Results  Component Value Date   VD25OH 52.53 05/04/2021   Stable, cont oral replacement

## 2021-05-09 NOTE — Assessment & Plan Note (Signed)
BP Readings from Last 3 Encounters:  11/03/20 140/70  08/12/20 (!) 153/62  06/10/20 (!) 142/70   Most recenly has been borderline to uncontrolled, pt now states BP at home < 140/90, will continue to monitor, pt to continue medical treatment zestoretic

## 2021-06-01 ENCOUNTER — Other Ambulatory Visit: Payer: Self-pay

## 2021-06-01 ENCOUNTER — Ambulatory Visit (INDEPENDENT_AMBULATORY_CARE_PROVIDER_SITE_OTHER)
Admission: RE | Admit: 2021-06-01 | Discharge: 2021-06-01 | Disposition: A | Discharge status: Home / Self Care | Payer: Self-pay | Source: Ambulatory Visit | Attending: Internal Medicine | Admitting: Internal Medicine

## 2021-06-01 ENCOUNTER — Encounter: Payer: Self-pay | Admitting: Internal Medicine

## 2021-06-01 ENCOUNTER — Other Ambulatory Visit: Payer: Self-pay | Admitting: Internal Medicine

## 2021-06-01 DIAGNOSIS — R931 Abnormal findings on diagnostic imaging of heart and coronary circulation: Secondary | ICD-10-CM | POA: Insufficient documentation

## 2021-06-01 DIAGNOSIS — I2510 Atherosclerotic heart disease of native coronary artery without angina pectoris: Secondary | ICD-10-CM

## 2021-06-01 DIAGNOSIS — I700 Atherosclerosis of aorta: Secondary | ICD-10-CM | POA: Insufficient documentation

## 2021-06-01 DIAGNOSIS — I10 Essential (primary) hypertension: Secondary | ICD-10-CM

## 2021-06-01 DIAGNOSIS — I2583 Coronary atherosclerosis due to lipid rich plaque: Secondary | ICD-10-CM

## 2021-06-01 DIAGNOSIS — R739 Hyperglycemia, unspecified: Secondary | ICD-10-CM

## 2021-06-01 DIAGNOSIS — E782 Mixed hyperlipidemia: Secondary | ICD-10-CM

## 2021-06-01 HISTORY — DX: Atherosclerosis of aorta: I70.0

## 2021-06-01 MED ORDER — ASPIRIN 81 MG PO TBEC: 81.0000 mg | DELAYED_RELEASE_TABLET | Freq: Every day | ORAL | 12 refills | Status: AC

## 2021-06-01 MED ORDER — ATORVASTATIN CALCIUM 40 MG PO TABS: 40.0000 mg | ORAL_TABLET | Freq: Every day | ORAL | 3 refills | Status: DC

## 2021-06-28 NOTE — Progress Notes (Signed)
Cardiology Office Note:    Date:  06/29/2021   ID:  Juanda, Luba Dec 23, 1945, MRN [id number]  PCP:  Biagio Borg, MD   [place] Prov[facility] Cardiologist:  Janina Mayo, MD     Referring MD: Biagio Borg, MD   Chief Complaint  Patient presents with   New Patient (Initial Visit)   Headache   Shortness of Breath   Edema  Elevated CAC   History of Present Illness:    Alexis Price is a 76 y.o. female with a hx of HLD, claudication,  referral for CAC  She states that she had brothers (71)  that all died of MI. She told her PCP and coronary CTA was ordered.  Her CAC 889, 93rd percentile. The LAD and circ have significant CAC. She lives in [place]. She walks every morning. She has calf pain on the left with activity that has progressed. She feels tingling in the leg at night. No report of significant rest pain. No discoloration. She has known claudication on the left with abnormal ABIs. She takes aspirin and atorvastatin. LDL 110 mg/dL  in 2021. She denies chest pain or SOB. She denies orthopnea, PND. She notes some swelling in the feet at night. She denies smoking hx.   She has no cardiac hx. She's not had a stress test or echo. She has not followed with cardiology.  Cardiology Studies CAC 06/01/2021  FINDINGS: Non-cardiac: See separate report from [facility].   Ascending Aorta: Normal caliber.   Aortic atherosclerosis.   Pericardium: Small pericardial calcification anterior to the right ventricle.   Aortic Valve calcium score 166.   Coronary arteries: Normal origins.   Coronary Calcium Score:   Left main: 76   Left anterior descending artery: 521   Left circumflex artery: 228   Right coronary artery: 64   Total: 889   Percentile: 93rd for age, sex, and race matched control.   IMPRESSION: 1. Coronary calcium score of 889. This was 93rd percentile for age, gender, and race matched controls.   2. Aortic atherosclerosis.   3.  Aortic Valve calcium score 166.    ABI/Duplex  Summary:  Right: Resting right ankle-brachial index is within normal range. No  evidence of significant right lower extremity arterial disease.   Left: Resting left ankle-brachial index indicates moderate left lower  extremity arterial disease.    Past Medical History:  Diagnosis Date   Anemia    Aortic atherosclerosis ([place]) 06/01/2021   Arthritis    Cancer ([place])    Diverticulosis    Essential hypertension 08/24/2007   Qualifier: Diagnosis of  By: Tiney Rouge CMA, [place]    Hyperlipidemia 08/24/2007   Qualifier: Diagnosis of  By: Tiney Rouge CMA, Ellison Hughs   medicaation - simvastatin 20 mg  On for > 1 year with no adverse side affects    HYPERTENSION    Postmenopausal atrophic vaginitis 05/12/2011   Temporal arteritis ([place]) 06/20/2017    Past Surgical History:  Procedure Laterality Date   COLON SURGERY     Emergent colectomy with creation of colosomy     flex sigmoidoscopy  1998   [place] of colostomy with salpingectomy and lysis of adhesion  04/2005   TONSILLECTOMY     age 40   TUBAL LIGATION  1976    Current Medications: Current Meds  Medication Sig   aspirin 81 MG EC tablet Take 1 tablet (81 mg total) by mouth daily. Swallow whole.  iron polysaccharides (NU-IRON) 150 MG capsule Take 1 capsule (150 mg total) by mouth daily.   lisinopril-hydrochlorothiazide (ZESTORETIC) 20-12.5 MG tablet Take 2 tablets by mouth daily.   rosuvastatin (CRESTOR) 20 MG tablet Take 1 tablet (20 mg total) by mouth daily.   [DISCONTINUED] atorvastatin (LIPITOR) 40 MG tablet Take 1 tablet (40 mg total) by mouth daily.   [DISCONTINUED] Multiple Vitamin (MULTIVITAMIN) tablet Take 1 tablet by mouth daily. Takes Nature's Bounty Vit D 3 Daily     Allergies:   Patient has no allergy information on record.   Social History   Socioeconomic History   Marital status: Married    Spouse name: Not on file   Number of children: 2   Years of  education: 16   Highest education level: Not on file  Occupational History   Occupation: Glass blower/designer  Tobacco Use   Smoking status: Never   Smokeless tobacco: Never  Vaping Use   Vaping Use: Never used  Substance and Sexual Activity   Alcohol use: Yes    Comment: 3-4 times per week   Drug use: No   Sexual activity: Yes    Partners: Male  Other Topics Concern   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 2 sons ' '69, '77. 4 grand-daughters. 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    Denies abuse and feels safe at home.    Social Determinants of Health   Financial Resource Strain: Low Risk    Difficulty of Paying Living Expenses: Not hard at all  Food Insecurity: No Food Insecurity   Worried About Charity fundraiser in the Last Year: Never true   [place] in the Last Year: Never true  Transportation Needs: No Transportation Needs   Lack of Transportation (Medical): No   Lack of Transportation (Non-Medical): No  Physical Activity: Insufficiently Active   Days of Exercise per Week: 4 days   Minutes of Exercise per Session: 30 min  Stress: No Stress Concern Present   Feeling of Stress : Not at all  Social Connections: Socially Integrated   Frequency of Communication with Friends and Family: More than three times a week   Frequency of Social Gatherings with Friends and Family: More than three times a week   Attends Religious Services: More than 4 times per year   Active Member of [employer] or Organizations: No   Attends Music therapist: More than 4 times per year   Marital Status: Married     Family History: The patient's  brothers (3)  that all died of MI. family history includes Coronary artery disease in her mother; Heart disease in her mother; Hypertension in her mother. Her father died of MI at 75.  ROS:   Please see the history of present illness.     All other systems reviewed and are  negative.  EKGs/Labs/Other Studies Reviewed:    The following studies were reviewed today:   EKG:  EKG is  ordered today.  The ekg ordered today demonstrates   NSR  Recent Labs: 05/04/2021: ALT 18; BUN 21; Creatinine, Ser 0.82; Hemoglobin 12.2; Platelets 270.0; Potassium 4.1; Sodium 137; TSH 2.03  Recent Lipid Panel    Component Value Date/Time   CHOL 235 (H) 05/04/2021 0901   TRIG 207.0 (H) 05/04/2021 0901   HDL 66.10 05/04/2021 0901   CHOLHDL 4 05/04/2021 0901   VLDL 41.4 (H) 05/04/2021 0901   LDLCALC 110 (H)  08/23/2019 1636   LDLDIRECT 140.0 05/04/2021 0901     Risk Assessment/Calculations:           Physical Exam:    VS:  BP 132/70 (BP Location: Left Arm, Patient Position: Sitting, Cuff Size: Normal)    Pulse 65    Ht 5\' 2"  (1.575 m)    Wt 164 lb (74.4 kg)    BMI 30.00 kg/m     Wt Readings from Last 3 Encounters:  06/29/21 164 lb (74.4 kg)  11/03/20 168 lb (76.2 kg)  08/12/20 167 lb 3.2 oz (75.8 kg)     GEN:  Well nourished, well developed in no acute distress HEENT: Normal NECK: No JVD; No carotid bruits LYMPHATICS: No lymphadenopathy CARDIAC: RRR, III/VI SEM RUSB, rubs, gallops Vasc: 2+ radial pulses BL RESPIRATORY:  Clear to auscultation without rales, wheezing or rhonchi  ABDOMEN: Soft, non-tender, non-distended MUSCULOSKELETAL:  No edema; No deformity  SKIN: Warm and well perfused  NEUROLOGIC:  Alert and oriented x 3 PSYCHIATRIC:  Normal affect   ASSESSMENT:    #Elevated CAC: She's at increased risk of CAD. She is asymptomatic. Goal is for CVD risk mitigation. She has elevated CAC 93rd percentile for age. She is > 75 ; only subgroup analysis data for this group in terms of benefits of statin therapy. Crestor did show reduction in ASCVD risk in this age group compared to placebo. A1c 5.9%. If she develops symptoms of CP/SOB can consider LHC considering she is high risk. - stop atorvastatin - start crestor 20 mg daily - LDL goal at least < 70  mg/dL  PAD: + claudication. No signs of acute or chronic limb ischemia. Considering symptoms are progressive will refer to [facility]. -continue on antiplatelet -continue on statin -continue exercise  PLAN:    In order of problems listed above:  PV clinic Crestor 20 mg daily Stop atorvastatin Lipid profile TTE Follow up 3 months         Medication Adjustments/Labs and Tests Ordered: Current medicines are reviewed at length with the patient today.  Concerns regarding medicines are outlined above.  Orders Placed This Encounter  Procedures   Lipid panel   Ambulatory Referral for Peripheral Vascular Consult   EKG 12-Lead   ECHOCARDIOGRAM COMPLETE   Meds ordered this encounter  Medications   rosuvastatin (CRESTOR) 20 MG tablet    Sig: Take 1 tablet (20 mg total) by mouth daily.    Dispense:  90 tablet    Refill:  3    Patient Instructions  Medication Instructions:  STOP ATORVASTATIN  START: CRESTOR 20mg  ONCE DAILY  *If you need a refill on your cardiac medications before your next appointment, please call your pharmacy*  Lab Work: Please return for Blood Work in [place]. No appointment needed, lab here at the office is open Monday-Friday from 8AM to 4PM and closed daily for lunch from 12:45-1:45. YOU WILL NEED TO FAST   If you have labs (blood work) drawn today and your tests are completely normal, you will receive your results only by: MyChart Message (if you have MyChart) OR A paper copy in the mail If you have any lab test that is abnormal or we need to change your treatment, we will call you to review the results.  Testing/Procedures: Your physician has requested that you have an echocardiogram. Echocardiography is a painless test that uses sound waves to create images of your heart. It provides your doctor with information about the size and shape of your  heart and how well your hearts chambers and valves are working. You may receive an ultrasound enhancing agent  through an IV if needed to better visualize your heart during the echo.This procedure takes approximately one hour. There are no restrictions for this procedure. This will take place at the [address], [address].   Follow-Up: At [facility], you and your health needs are our priority.  As part of our continuing mission to provide you with exceptional heart care, we have created designated Provider Care Teams.  These Care Teams include your primary Cardiologist (physician) and Advanced Practice Providers (APPs -  Physician Assistants and Nurse Practitioners) who all work together to provide you with the care you need, when you need it.  Your next appointment:   3 month(s)  The format for your next appointment:   In Person  Provider:   Janina Mayo, MD    Other Instructions REFERRAL TO [place]. [place]    Signed, Janina Mayo, MD  06/29/2021 10:33 AM    [facility]

## 2021-06-29 ENCOUNTER — Encounter: Payer: Self-pay | Admitting: Internal Medicine

## 2021-06-29 ENCOUNTER — Other Ambulatory Visit: Payer: Self-pay

## 2021-06-29 ENCOUNTER — Ambulatory Visit (INDEPENDENT_AMBULATORY_CARE_PROVIDER_SITE_OTHER): Payer: Medicare Other | Admitting: Internal Medicine

## 2021-06-29 VITALS — BP 132/70 | HR 65 | Ht 62.0 in | Wt 164.0 lb

## 2021-06-29 DIAGNOSIS — I739 Peripheral vascular disease, unspecified: Secondary | ICD-10-CM | POA: Diagnosis not present

## 2021-06-29 DIAGNOSIS — R931 Abnormal findings on diagnostic imaging of heart and coronary circulation: Secondary | ICD-10-CM

## 2021-06-29 DIAGNOSIS — I700 Atherosclerosis of aorta: Secondary | ICD-10-CM | POA: Diagnosis not present

## 2021-06-29 MED ORDER — ROSUVASTATIN CALCIUM 20 MG PO TABS: 20.0000 mg | ORAL_TABLET | Freq: Every day | ORAL | 3 refills | Status: DC

## 2021-06-29 NOTE — Patient Instructions (Signed)
Medication Instructions:  STOP ATORVASTATIN  START: CRESTOR 20mg  ONCE DAILY  *If you need a refill on your cardiac medications before your next appointment, please call your pharmacy*  Lab Work: Please return for Blood Work in [place]. No appointment needed, lab here at the office is open Monday-Friday from 8AM to 4PM and closed daily for lunch from 12:45-1:45. YOU WILL NEED TO FAST   If you have labs (blood work) drawn today and your tests are completely normal, you will receive your results only by: MyChart Message (if you have MyChart) OR A paper copy in the mail If you have any lab test that is abnormal or we need to change your treatment, we will call you to review the results.  Testing/Procedures: Your physician has requested that you have an echocardiogram. Echocardiography is a painless test that uses sound waves to create images of your heart. It provides your doctor with information about the size and shape of your heart and how well your hearts chambers and valves are working. You may receive an ultrasound enhancing agent through an IV if needed to better visualize your heart during the echo.This procedure takes approximately one hour. There are no restrictions for this procedure. This will take place at the [address]   Follow-Up: At [facility], you and your health needs are our priority.  As part of our continuing mission to provide you with exceptional heart care, we have created designated Provider Care Teams.  These Care Teams include your primary Cardiologist (physician) and Advanced Practice Providers (APPs -  Physician Assistants and Nurse Practitioners) who all work together to provide you with the care you need, when you need it.  Your next appointment:   3 month(s)  The format for your next appointment:   In Person  Provider:   Janina Mayo, MD    Other Instructions REFERRAL TO [place]. [place]

## 2021-08-02 ENCOUNTER — Other Ambulatory Visit (HOSPITAL_COMMUNITY): Payer: Medicare Other

## 2021-08-03 ENCOUNTER — Ambulatory Visit (INDEPENDENT_AMBULATORY_CARE_PROVIDER_SITE_OTHER): Payer: Medicare Other | Admitting: Cardiovascular Disease

## 2021-08-03 ENCOUNTER — Other Ambulatory Visit: Payer: Self-pay

## 2021-08-03 ENCOUNTER — Ambulatory Visit (HOSPITAL_COMMUNITY): Payer: Medicare Other | Attending: Cardiovascular Disease

## 2021-08-03 ENCOUNTER — Encounter: Payer: Self-pay | Admitting: Cardiovascular Disease

## 2021-08-03 VITALS — BP 132/70 | HR 62 | Ht 63.0 in | Wt 159.0 lb

## 2021-08-03 DIAGNOSIS — R931 Abnormal findings on diagnostic imaging of heart and coronary circulation: Secondary | ICD-10-CM

## 2021-08-03 DIAGNOSIS — I739 Peripheral vascular disease, unspecified: Secondary | ICD-10-CM

## 2021-08-03 DIAGNOSIS — E782 Mixed hyperlipidemia: Secondary | ICD-10-CM | POA: Diagnosis not present

## 2021-08-03 DIAGNOSIS — I2510 Atherosclerotic heart disease of native coronary artery without angina pectoris: Secondary | ICD-10-CM

## 2021-08-03 DIAGNOSIS — I700 Atherosclerosis of aorta: Secondary | ICD-10-CM | POA: Insufficient documentation

## 2021-08-03 DIAGNOSIS — I2583 Coronary atherosclerosis due to lipid rich plaque: Secondary | ICD-10-CM | POA: Diagnosis not present

## 2021-08-03 LAB — ECHOCARDIOGRAM COMPLETE
Area-P 1/2: 2.99 cm2
Height: 63 in
S' Lateral: 2.7 cm
Weight: 2544 oz

## 2021-08-03 MED ORDER — CILOSTAZOL 50 MG PO TABS: 50.0000 mg | ORAL_TABLET | Freq: Two times a day (BID) | ORAL | 1 refills | Status: DC

## 2021-08-03 NOTE — Progress Notes (Signed)
?  ?Cardiology Office Note ? ? ?Date:  08/03/2021  ? ?ID:  Alexis Price, DOB [birth year], MRN [id number] ? ?PCP:  Biagio Borg, MD  ?Cardiologist: Dr. Phineas Inches ? ?No chief complaint on file. ? ? ?  ?History of Present Illness: ?Alexis Price is a 76 y.o. female who was referred by Dr. Harl Bowie for evaluation and management of peripheral arterial disease.  She has known history of hyperlipidemia and essential hypertension.  She has family history of coronary artery disease.  She had coronary calcium score which was elevated at 889.   ?She reports left calf claudication which started few years ago.  It has been more noticeable recently as she became more active with walking.  She moved to [facility] but continues her medical follow-up here in [place].  She has no rest pain or lower extremity ulceration. ?  She was seen recently for left calf claudication.  Previous ABI in 2021 was normal on the right and moderately reduced on the left side.  ? ?She denies chest pain or shortness of breath.  She was recently switched from atorvastatin to rosuvastatin.  She is a lifelong non-smoker and is not diabetic. ? ?Past Medical History:  ?Diagnosis Date  ? Anemia   ? Aortic atherosclerosis ([place]) 06/01/2021  ? Arthritis   ? Cancer [facility])   ? Diverticulosis   ? Essential hypertension 08/24/2007  ? Qualifier: Diagnosis of  By: Tiney Rouge CMA, Ellison Hughs    ? HYPERLIPIDEMIA   ? Hyperlipidemia 08/24/2007  ? Qualifier: Diagnosis of  By: Tiney Rouge CMA, Ellison Hughs   medicaation - simvastatin 20 mg  On for > 1 year with no adverse side affects   ? HYPERTENSION   ? Postmenopausal atrophic vaginitis 05/12/2011  ? Temporal arteritis ([place]) 06/20/2017  ? ? ?Past Surgical History:  ?Procedure Laterality Date  ? COLON SURGERY    ? Emergent colectomy with creation of colosomy    ? flex sigmoidoscopy  1998  ? [place] of colostomy with salpingectomy and lysis of adhesion  04/2005  ? TONSILLECTOMY    ? age 60  ? TUBAL LIGATION  1976   ? ? ? ?Current Outpatient Medications  ?Medication Sig Dispense Refill  ? aspirin 81 MG EC tablet Take 1 tablet (81 mg total) by mouth daily. Swallow whole. 30 tablet 12  ? cilostazol (PLETAL) 50 MG tablet Take 1 tablet (50 mg total) by mouth 2 (two) times daily. 180 tablet 1  ? iron polysaccharides (NU-IRON) 150 MG capsule Take 1 capsule (150 mg total) by mouth daily. 90 capsule 1  ? lisinopril-hydrochlorothiazide (ZESTORETIC) 20-12.5 MG tablet Take 2 tablets by mouth daily. 180 tablet 3  ? rosuvastatin (CRESTOR) 20 MG tablet Take 1 tablet (20 mg total) by mouth daily. 90 tablet 3  ? ?No current facility-administered medications for this visit.  ? ? ?Allergies:   Patient has no known allergies.  ? ? ?Social History:  The patient  reports that she has never smoked. She has never used smokeless tobacco. She reports current alcohol use. She reports that she does not use drugs.  ? ?Family History:  The patient's family history includes Coronary artery disease in her mother; Heart disease in her mother; Hypertension in her mother.  ? ? ?ROS:  Please see the history of present illness.   Otherwise, review of systems are positive for none.   All other systems are reviewed and negative.  ? ? ?PHYSICAL EXAM: ?VS:  BP 132/70   Pulse 62  Ht '5\' 3"'$  (1.6 m)   Wt 159 lb (72.1 kg)   SpO2 98%   BMI 28.17 kg/m?  , BMI Body mass index is 28.17 kg/m?. ?GEN: Well nourished, well developed, in no acute distress  ?HEENT: normal  ?Neck: no JVD, carotid bruits, or masses ?Cardiac: RRR; no rubs, or gallops,no edema .  2 out of 6 systolic murmur in the aortic area with radiation to carotid arteries. ?Respiratory:  clear to auscultation bilaterally, normal work of breathing ?GI: soft, nontender, nondistended, + BS ?MS: no deformity or atrophy  ?Skin: warm and dry, no rash ?Neuro:  Strength and sensation are intact ?Psych: euthymic mood, full affect ?Vascular: Distal pulses are palpable on the right side but not the left. ? ? ?EKG:  EKG  is not ordered today. ? ? ? ?Recent Labs: ?05/04/2021: ALT 18; BUN 21; Creatinine, Ser 0.82; Hemoglobin 12.2; Platelets 270.0; Potassium 4.1; Sodium 137; TSH 2.03  ? ? ?Lipid Panel ?   ?Component Value Date/Time  ? CHOL 235 (H) 05/04/2021 0901  ? TRIG 207.0 (H) 05/04/2021 0901  ? HDL 66.10 05/04/2021 0901  ? CHOLHDL 4 05/04/2021 0901  ? VLDL 41.4 (H) 05/04/2021 0901  ? [place] 110 (H) 08/23/2019 1636  ? LDLDIRECT 140.0 05/04/2021 0901  ? ?  ? ?Wt Readings from Last 3 Encounters:  ?08/03/21 159 lb (72.1 kg)  ?06/29/21 164 lb (74.4 kg)  ?11/03/20 168 lb (76.2 kg)  ?  ? ? ?No flowsheet data found. ? ? ? ?ASSESSMENT AND PLAN: ? ?1.  Peripheral arterial disease: Moderate left calf claudication likely due to left SFA/popliteal artery occlusion.  She reports some worsening of symptoms over the last 6 months and thus I am going to repeat her lower extremity arterial Doppler.  I discussed with her the natural history and management of claudication.  Overall, this is not a limb threatening situation.  The focus of treatment is symptomatic relief and aggressive risk factor modification.  I elected to add cilostazol 50 mg twice daily.  I encouraged her to continue a walking exercise program and provided her with instructions. ? ?2.  Coronary artery calcifications: Currently with no anginal symptoms. ? ?3.  Cardiac murmur: Seems to be suggestive of aortic sclerosis.  She is scheduled for an echocardiogram. ? ?4.  Hyperlipidemia: She was on atorvastatin but her LDL was 140.  I agree with switching to rosuvastatin.  Recommend a target LDL of less than 70 and preferably less than 55. ? ? ? ?Disposition:   FU with me in 6 months ? ?Signed, ? ?Kathlyn Sacramento, MD  ?08/03/2021 9:54 AM    ?[place] ?

## 2021-08-03 NOTE — Patient Instructions (Addendum)
Medication Instructions:  ?START Pletal 50 mg twice daily  ? ?*If you need a refill on your cardiac medications before your next appointment, please call your pharmacy* ? ? ?Testing/Procedures: ?Your physician has requested that you have a lower extremity arterial duplex. This test is an ultrasound of the arteries in the legs. It looks at arterial blood flow in the legs. Allow one hour for Lower Arterial scans. There are no restrictions or special instructions  ? ?Follow-Up: ?At [facility], you and your health needs are our priority.  As part of our continuing mission to provide you with exceptional heart care, we have created designated Provider Care Teams.  These Care Teams include your primary Cardiologist (physician) and Advanced Practice Providers (APPs -  Physician Assistants and Nurse Practitioners) who all work together to provide you with the care you need, when you need it. ? ?We recommend signing up for the patient portal called "MyChart".  Sign up information is provided on this After Visit Summary.  MyChart is used to connect with patients for Virtual Visits (Telemedicine).  Patients are able to view lab/test results, encounter notes, upcoming appointments, etc.  Non-urgent messages can be sent to your provider as well.   ?To learn more about what you can do with MyChart, go to [email or web address].   ? ?Your next appointment:   ?6 month(s) ? ?The format for your next appointment:   ?In Person ? ?Provider:   ?Kathlyn Sacramento, MD  ? ? ?Other Instructions ? ? ?EXERCISE PROGRAM FOR INDIVIDUALS WITH  ?PERIPHERAL ARTERIAL DISEASE (PAD)  ? ?General Information:  ? ?Research in vascular exercise has demonstrated remarkable improvement in symptoms of leg pain (claudication) without expensive or invasive interventions. Regular walking programs are extremely helpful for patients with PAD and intermittent claudication.  ?These steps are designed to help you get started with a safe and effective program to  help you walk farther with less pain:  ? Walk at least three times a week (preferably every day). ? Your goal is to build up to 30-45 minutes of total walking time (not counting rest breaks). It may take you several weeks to build up your exercise time starting at 5-10 minutes or whatever you can tolerate. ? Walk as far as possible using moderate to maximal pain (7-8 on the scale below) as a signal to stop, and resume walking when the pain goes away. ? On a treadmill, set the speed and grade at a level that brings on the claudication pain within 3 to 5 minutes. Walk at this rate until you experience claudication of moderate severity, rest until the pain improves, and then resume walking. ? Over time, you will be able to walk longer at the designated speed and grade; workload should then be increased until you develop the pain within 3 to 5 minutes once again. ? This regimen will induce a significant benefit. Studies have demonstrated that participants may be able to walk up to three or four times farther and have less leg pain, within twelve weeks, by following this protocol. ? ?Pain Scale  ? ? 0_____1_____2_____3_____4_____5_____6_____7_____8_____9_____10  ? No Pain                                   Moderate Pain  Maximal Pain ? ?

## 2021-08-18 ENCOUNTER — Other Ambulatory Visit: Payer: Self-pay | Admitting: *Deleted

## 2021-08-18 LAB — LIPID PANEL
Chol/HDL Ratio: 2.3 ratio (ref 0.0–4.4)
Cholesterol, Total: 204 mg/dL — ABNORMAL HIGH (ref 100–199)
HDL: 87 mg/dL (ref >39)
LDL Chol Calc (NIH): 101 mg/dL — ABNORMAL HIGH (ref 0–99)
Triglycerides: 92 mg/dL (ref 0–149)
VLDL Cholesterol Cal: 16 mg/dL (ref 5–40)

## 2021-08-18 MED ORDER — ROSUVASTATIN CALCIUM 40 MG PO TABS: 40.0000 mg | ORAL_TABLET | Freq: Every day | ORAL | 3 refills | Status: DC

## 2021-08-23 ENCOUNTER — Other Ambulatory Visit: Payer: Self-pay | Admitting: Cardiovascular Disease

## 2021-08-23 DIAGNOSIS — I739 Peripheral vascular disease, unspecified: Secondary | ICD-10-CM

## 2021-09-01 ENCOUNTER — Ambulatory Visit (HOSPITAL_COMMUNITY)
Admission: RE | Admit: 2021-09-01 | Discharge: 2021-09-01 | Disposition: A | Discharge status: Home / Self Care | Payer: Medicare Other | Source: Ambulatory Visit | Attending: Cardiovascular Disease | Admitting: Cardiovascular Disease

## 2021-09-01 DIAGNOSIS — I739 Peripheral vascular disease, unspecified: Secondary | ICD-10-CM

## 2021-09-27 ENCOUNTER — Ambulatory Visit: Payer: Medicare Other | Admitting: Internal Medicine

## 2021-11-02 ENCOUNTER — Telehealth: Payer: Self-pay | Admitting: Internal Medicine

## 2021-11-02 NOTE — Telephone Encounter (Signed)
Left message for patient to call back to schedule Medicare Annual Wellness Visit   Last AWV 11/03/20  Please schedule at anytime with [facility]) if patient calls the office back.     Any questions, please call me at [phone]

## 2021-11-10 ENCOUNTER — Encounter: Payer: Self-pay | Admitting: Internal Medicine

## 2021-11-10 ENCOUNTER — Ambulatory Visit (INDEPENDENT_AMBULATORY_CARE_PROVIDER_SITE_OTHER): Payer: Medicare Other | Admitting: Internal Medicine

## 2021-11-10 VITALS — BP 172/86 | HR 59 | Ht 63.0 in | Wt 155.8 lb

## 2021-11-10 DIAGNOSIS — E782 Mixed hyperlipidemia: Secondary | ICD-10-CM | POA: Diagnosis not present

## 2021-11-10 MED ORDER — HYDROCHLOROTHIAZIDE 25 MG PO TABS: 25.0000 mg | ORAL_TABLET | Freq: Every day | ORAL | 3 refills | Status: DC

## 2021-11-10 MED ORDER — LISINOPRIL 40 MG PO TABS: 40.0000 mg | ORAL_TABLET | Freq: Every day | ORAL | 3 refills | Status: DC

## 2021-11-10 NOTE — Progress Notes (Signed)
Cardiology Office Note:    Date:  11/10/2021   ID:  TYRESHA FEDE, DOB [birth year], MRN [id number]  PCP:  Biagio Borg, MD   [facility] Cardiologist:  Janina Mayo, MD     Referring MD: Biagio Borg, MD   No chief complaint on file. Elevated CAC   History of Present Illness:    CLYDIA NIEVES is a 76 y.o. female with a hx of HLD, claudication,  referral for CAC  She states that she had brothers (3)  that all died of MI. She told her PCP and coronary CTA was ordered.  Her CAC 889, 93rd percentile. The LAD and circ have significant CAC. She lives in [place]. She walks every morning. She has calf pain on the left with activity that has progressed. She feels tingling in the leg at night. No report of significant rest pain. No discoloration. She has known claudication on the left with abnormal ABIs. She takes aspirin and atorvastatin. LDL 110 mg/dL  in 2021. She denies chest pain or SOB. She denies orthopnea, PND. She notes some swelling in the feet at night. She denies smoking hx.   She has no cardiac hx. She's not had a stress test or echo. She has not followed with cardiology.  Interim Hx 11/10/2021 Hadn't starting the cilostazol.  Still has left leg cramping. She did not take her BP medications. Blood pressures at home; SBP closer to 140s-150s at home.   Cardiology Studies CAC 06/01/2021  FINDINGS: Non-cardiac: See separate report from [facility].   Ascending Aorta: Normal caliber.   Aortic atherosclerosis.   Pericardium: Small pericardial calcification anterior to the right ventricle.   Aortic Valve calcium score 166.   Coronary arteries: Normal origins.   Coronary Calcium Score:   Left main: 76   Left anterior descending artery: 521   Left circumflex artery: 228   Right coronary artery: 64   Total: 889   Percentile: 93rd for age, sex, and race matched control.   IMPRESSION: 1. Coronary calcium score of 889. This was  93rd percentile for age, gender, and race matched controls.   2. Aortic atherosclerosis.   3. Aortic Valve calcium score 166.    ABI/Duplex  Summary:  Right: Resting right ankle-brachial index is within normal range. No  evidence of significant right lower extremity arterial disease.   Left: Resting left ankle-brachial index indicates moderate left lower  extremity arterial disease.    Past Medical History:  Diagnosis Date   Anemia    Aortic atherosclerosis ([place]) 06/01/2021   Arthritis    Cancer ([place])    Diverticulosis    Essential hypertension 08/24/2007   Qualifier: Diagnosis of  By: Tiney Rouge CMA, [place]    Hyperlipidemia 08/24/2007   Qualifier: Diagnosis of  By: Tiney Rouge CMA, Ellison Hughs   medicaation - simvastatin 20 mg  On for > 1 year with no adverse side affects    HYPERTENSION    Postmenopausal atrophic vaginitis 05/12/2011   Temporal arteritis ([place]) 06/20/2017    Past Surgical History:  Procedure Laterality Date   COLON SURGERY     Emergent colectomy with creation of colosomy     flex sigmoidoscopy  1998   [place] of colostomy with salpingectomy and lysis of adhesion  04/2005   TONSILLECTOMY     age 75   TUBAL LIGATION  1976    Current Medications: Current Meds  Medication Sig   hydrochlorothiazide (HYDRODIURIL) 25 MG tablet  Take 1 tablet (25 mg total) by mouth daily.   lisinopril (ZESTRIL) 40 MG tablet Take 1 tablet (40 mg total) by mouth daily.     Allergies:   Patient has no known allergies.   Social History   Socioeconomic History   Marital status: Married    Spouse name: Not on file   Number of children: 2   Years of education: 16   Highest education level: Not on file  Occupational History   Occupation: Glass blower/designer  Tobacco Use   Smoking status: Never   Smokeless tobacco: Never  Vaping Use   Vaping Use: Never used  Substance and Sexual Activity   Alcohol use: Yes    Comment: 3-4 times per week   Drug use: No   Sexual  activity: Yes    Partners: Male  Other Topics Concern   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 2 sons ' '69, '77. 4 grand-daughters. 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    Denies abuse and feels safe at home.    Social Determinants of Health   Financial Resource Strain: Low Risk  (11/03/2020)   Overall Financial Resource Strain (CARDIA)    Difficulty of Paying Living Expenses: Not hard at all  Food Insecurity: No Food Insecurity (11/03/2020)   Hunger Vital Sign    Worried About Running Out of Food in the Last Year: Never true    Ran Out of Food in the Last Year: Never true  Transportation Needs: No Transportation Needs (11/03/2020)   PRAPARE - Hydrologist (Medical): No    Lack of Transportation (Non-Medical): No  Physical Activity: Insufficiently Active (11/03/2020)   Exercise Vital Sign    Days of Exercise per Week: 4 days    Minutes of Exercise per Session: 30 min  Stress: No Stress Concern Present (11/03/2020)   [place]    Feeling of Stress : Not at all  Social Connections: [place] (11/03/2020)   Social Connection and Isolation Panel [NHANES]    Frequency of Communication with Friends and Family: More than three times a week    Frequency of Social Gatherings with Friends and Family: More than three times a week    Attends Religious Services: More than 4 times per year    Active Member of [employer] or Organizations: No    Attends Music therapist: More than 4 times per year    Marital Status: Married     Family History: The patient's  brothers (43)  that all died of MI. family history includes Coronary artery disease in her mother; Heart disease in her mother; Hypertension in her mother. Her father died of MI at 24.  ROS:   Please see the history of present illness.     All other systems reviewed and are  negative.  EKGs/Labs/Other Studies Reviewed:    The following studies were reviewed today:   EKG:  EKG is  ordered today.  The ekg ordered today demonstrates   NSR  11/10/2021-sinus bradycardia HR 59 bpm  Recent Labs: 05/04/2021: ALT 18; BUN 21; Creatinine, Ser 0.82; Hemoglobin 12.2; Platelets 270.0; Potassium 4.1; Sodium 137; TSH 2.03   Recent Lipid Panel    Component Value Date/Time   CHOL 204 (H) 08/17/2021 1020   TRIG 92 08/17/2021 1020   HDL 87 08/17/2021 1020   CHOLHDL 2.3 08/17/2021 1020   CHOLHDL  4 05/04/2021 0901   VLDL 41.4 (H) 05/04/2021 0901   LDLCALC 101 (H) 08/17/2021 1020   LDLCALC 110 (H) 08/23/2019 1636   LDLDIRECT 140.0 05/04/2021 0901     Risk Assessment/Calculations:           Physical Exam:    VS:   Vitals:   11/10/21 1027  BP: (!) 172/86  Pulse: (!) 59     Wt Readings from Last 3 Encounters:  11/10/21 155 lb 12.8 oz (70.7 kg)  08/03/21 159 lb (72.1 kg)  06/29/21 164 lb (74.4 kg)     GEN:  Well nourished, well developed in no acute distress HEENT: Normal NECK: No JVD; No carotid bruits LYMPHATICS: No lymphadenopathy CARDIAC: RRR, III/VI SEM RUSB (no significant AS), rubs, gallops Vasc: 2+ radial pulses BL RESPIRATORY:  Clear to auscultation without rales, wheezing or rhonchi  ABDOMEN: Soft, non-tender, non-distended MUSCULOSKELETAL:  No edema; No deformity  SKIN: Warm and well perfused  NEUROLOGIC:  Alert and oriented x 3 PSYCHIATRIC:  Normal affect   ASSESSMENT:    #Elevated CAC: She's at increased risk of CAD. She is asymptomatic. Goal is for CVD risk mitigation. She has elevated CAC 93rd percentile for age. She is > 75 ; only subgroup analysis data for this group in terms of benefits of statin therapy. Crestor did show reduction in ASCVD risk in this age group compared to placebo. A1c 5.9%. If she develops symptoms of CP/SOB can consider LHC considering she is high risk. - stopped atorvastatin - continue  crestor 40 mg daily  (increased from prior lipid profile 08/17/2021); Goal < 70 to < 55; will repeat lipid profile -TTE EF 60-70,  normal RV function, no significant valve disease.  PAD: + claudication. Left SFA/popliteal artery occlusion. No signs of acute or chronic limb ischemia. Considering symptoms are progressive will refer to [facility] clinic. - continue asa - continue on statin - continue exercise - continue cilostazol 50 mg BID   HTN: not optimally controlled. Will increase lisinopril-HCTz.  PLAN:    In order of problems listed above:  Fasting Lipid profile  Increase lisinopril to 40-25 HCTz Follow up 6 months         Medication Adjustments/Labs and Tests Ordered: Current medicines are reviewed at length with the patient today.  Concerns regarding medicines are outlined above.  Orders Placed This Encounter  Procedures   Lipid panel   EKG 12-Lead   Meds ordered this encounter  Medications   lisinopril (ZESTRIL) 40 MG tablet    Sig: Take 1 tablet (40 mg total) by mouth daily.    Dispense:  90 tablet    Refill:  3   hydrochlorothiazide (HYDRODIURIL) 25 MG tablet    Sig: Take 1 tablet (25 mg total) by mouth daily.    Dispense:  90 tablet    Refill:  3    Patient Instructions  Medication Instructions:  STOP lisinopril-hctz START lisinopril '40mg'$  daily and hydrochlorothiazide '25mg'$  daily  *If you need a refill on your cardiac medications before your next appointment, please call your pharmacy*   Lab Work: FASTING lab work to check cholesterol   If you have labs (blood work) drawn today and your tests are completely normal, you will receive your results only by: [place] (if you have MyChart) OR A paper copy in the mail If you have any lab test that is abnormal or we need to change your treatment, we will call you to review the results.  Follow-Up: At [facility],  you and your health needs are our priority.  As part of our continuing mission to provide you with exceptional  heart care, we have created designated Provider Care Teams.  These Care Teams include your primary Cardiologist (physician) and Advanced Practice Providers (APPs -  Physician Assistants and Nurse Practitioners) who all work together to provide you with the care you need, when you need it.  We recommend signing up for the patient portal called "MyChart".  Sign up information is provided on this After Visit Summary.  MyChart is used to connect with patients for Virtual Visits (Telemedicine).  Patients are able to view lab/test results, encounter notes, upcoming appointments, etc.  Non-urgent messages can be sent to your provider as well.   To learn more about what you can do with MyChart, go to [email or web address].    Your next appointment:   6 month(s)  The format for your next appointment:   In Person  Provider:   Dr. Phineas Inches   Signed, Janina Mayo, MD  11/10/2021 10:49 AM    [place]

## 2021-11-10 NOTE — Patient Instructions (Addendum)
Medication Instructions:  STOP lisinopril-hctz START lisinopril '40mg'$  daily and hydrochlorothiazide '25mg'$  daily  *If you need a refill on your cardiac medications before your next appointment, please call your pharmacy*   Lab Work: FASTING lab work to check cholesterol   If you have labs (blood work) drawn today and your tests are completely normal, you will receive your results only by: [place] (if you have MyChart) OR A paper copy in the mail If you have any lab test that is abnormal or we need to change your treatment, we will call you to review the results.  Follow-Up: At [facility], you and your health needs are our priority.  As part of our continuing mission to provide you with exceptional heart care, we have created designated Provider Care Teams.  These Care Teams include your primary Cardiologist (physician) and Advanced Practice Providers (APPs -  Physician Assistants and Nurse Practitioners) who all work together to provide you with the care you need, when you need it.  We recommend signing up for the patient portal called "MyChart".  Sign up information is provided on this After Visit Summary.  MyChart is used to connect with patients for Virtual Visits (Telemedicine).  Patients are able to view lab/test results, encounter notes, upcoming appointments, etc.  Non-urgent messages can be sent to your provider as well.   To learn more about what you can do with MyChart, go to [email or web address].    Your next appointment:   6 month(s)  The format for your next appointment:   In Person  Provider:   Dr. Phineas Inches

## 2021-11-23 ENCOUNTER — Ambulatory Visit (INDEPENDENT_AMBULATORY_CARE_PROVIDER_SITE_OTHER): Payer: Medicare Other

## 2021-11-23 DIAGNOSIS — Z0000 Encounter for general adult medical examination without abnormal findings: Secondary | ICD-10-CM

## 2022-02-08 ENCOUNTER — Ambulatory Visit: Payer: Medicare Other | Attending: Cardiovascular Disease | Admitting: Cardiovascular Disease

## 2022-02-08 ENCOUNTER — Other Ambulatory Visit: Payer: Self-pay | Admitting: Cardiovascular Disease

## 2022-02-08 ENCOUNTER — Encounter: Payer: Self-pay | Admitting: Cardiovascular Disease

## 2022-02-08 VITALS — BP 160/68 | HR 69 | Ht 62.0 in | Wt 158.2 lb

## 2022-02-08 DIAGNOSIS — I2510 Atherosclerotic heart disease of native coronary artery without angina pectoris: Secondary | ICD-10-CM | POA: Diagnosis not present

## 2022-02-08 DIAGNOSIS — E782 Mixed hyperlipidemia: Secondary | ICD-10-CM | POA: Insufficient documentation

## 2022-02-08 DIAGNOSIS — I10 Essential (primary) hypertension: Secondary | ICD-10-CM | POA: Diagnosis not present

## 2022-02-08 DIAGNOSIS — I2583 Coronary atherosclerosis due to lipid rich plaque: Secondary | ICD-10-CM

## 2022-02-08 DIAGNOSIS — I739 Peripheral vascular disease, unspecified: Secondary | ICD-10-CM | POA: Diagnosis not present

## 2022-02-08 MED ORDER — AMLODIPINE BESYLATE 2.5 MG PO TABS: 2.5000 mg | ORAL_TABLET | Freq: Every day | ORAL | 3 refills | Status: DC

## 2022-02-08 MED ORDER — LISINOPRIL 40 MG PO TABS: 80.0000 mg | ORAL_TABLET | Freq: Two times a day (BID) | ORAL | 3 refills | Status: DC

## 2022-02-08 NOTE — Telephone Encounter (Signed)
Prescription corrected and sent to pharmacy.

## 2022-02-08 NOTE — Progress Notes (Signed)
Cardiology Office Note   Date:  02/08/2022   ID:  Alexis Price, Alexis Price 07-14-1945, MRN [id number]  PCP:  Biagio Borg, MD  Cardiologist: Dr. Phineas Inches  No chief complaint on file.     History of Present Illness: Alexis Price is a 76 y.o. female who is here today for follow-up visit regarding peripheral arterial disease.    She has known history of hyperlipidemia and essential hypertension.  She has family history of coronary artery disease.  She had coronary calcium score which was elevated at 889.  She is a lifelong non-smoker and she is nondiabetic. She was seen for left calf claudication that started few years ago. Noninvasive vascular studies in April showed normal ABI on the right side and mildly reduced on the left at 0.85.  Duplex showed severe left common femoral artery stenosis into the proximal portion of the SFA. She will prescribe Pletal but did not get the medication.  She started walking on her own and her symptoms improved.  She is able to walk 3 miles daily now.  She gets mild left calf discomfort at the beginning that subsequently subsides.  No rest pain or lower extremity ulceration. No chest pain or shortness of breath.  Past Medical History:  Diagnosis Date   Anemia    Aortic atherosclerosis ([place]) 06/01/2021   Arthritis    Cancer ([place])    Diverticulosis    Essential hypertension 08/24/2007   Qualifier: Diagnosis of  By: Tiney Rouge CMA, [place]    Hyperlipidemia 08/24/2007   Qualifier: Diagnosis of  By: Tiney Rouge CMA, Ellison Hughs   medicaation - simvastatin 20 mg  On for > 1 year with no adverse side affects    HYPERTENSION    Postmenopausal atrophic vaginitis 05/12/2011   Temporal arteritis ([place]) 06/20/2017    Past Surgical History:  Procedure Laterality Date   COLON SURGERY     Emergent colectomy with creation of colosomy     flex sigmoidoscopy  1998   [place] of colostomy with salpingectomy and lysis of adhesion  04/2005    TONSILLECTOMY     age 9   TUBAL LIGATION  1976     Current Outpatient Medications  Medication Sig Dispense Refill   aspirin 81 MG EC tablet Take 1 tablet (81 mg total) by mouth daily. Swallow whole. 30 tablet 12   cilostazol (PLETAL) 50 MG tablet Take 1 tablet (50 mg total) by mouth 2 (two) times daily. 180 tablet 1   hydrochlorothiazide (HYDRODIURIL) 25 MG tablet Take 1 tablet (25 mg total) by mouth daily. 90 tablet 3   iron polysaccharides (NU-IRON) 150 MG capsule Take 1 capsule (150 mg total) by mouth daily. 90 capsule 1   lisinopril (ZESTRIL) 40 MG tablet Take 1 tablet (40 mg total) by mouth daily. 90 tablet 3   rosuvastatin (CRESTOR) 40 MG tablet Take 1 tablet (40 mg total) by mouth daily. 90 tablet 3   No current facility-administered medications for this visit.    Allergies:   Patient has no known allergies.    Social History:  The patient  reports that she has never smoked. She has never used smokeless tobacco. She reports current alcohol use. She reports that she does not use drugs.   Family History:  The patient's family history includes Coronary artery disease in her mother; Heart disease in her mother; Hypertension in her mother.    ROS:  Please see the history of present illness.  Otherwise, review of systems are positive for none.   All other systems are reviewed and negative.    PHYSICAL EXAM: VS:  There were no vitals taken for this visit. , BMI There is no height or weight on file to calculate BMI. GEN: Well nourished, well developed, in no acute distress  HEENT: normal  Neck: no JVD, carotid bruits, or masses Cardiac: RRR; no rubs, or gallops,no edema .  2 out of 6 systolic murmur in the aortic area with radiation to carotid arteries. Respiratory:  clear to auscultation bilaterally, normal work of breathing GI: soft, nontender, nondistended, + BS MS: no deformity or atrophy  Skin: warm and dry, no rash Neuro:  Strength and sensation are intact Psych:  euthymic mood, full affect Vascular: Distal pulses are palpable on the right side but not the left.   EKG:  EKG is not ordered today.    Recent Labs: 05/04/2021: ALT 18; BUN 21; Creatinine, Ser 0.82; Hemoglobin 12.2; Platelets 270.0; Potassium 4.1; Sodium 137; TSH 2.03    Lipid Panel    Component Value Date/Time   CHOL 204 (H) 08/17/2021 1020   TRIG 92 08/17/2021 1020   HDL 87 08/17/2021 1020   CHOLHDL 2.3 08/17/2021 1020   CHOLHDL 4 05/04/2021 0901   VLDL 41.4 (H) 05/04/2021 0901   LDLCALC 101 (H) 08/17/2021 1020   LDLCALC 110 (H) 08/23/2019 1636   LDLDIRECT 140.0 05/04/2021 0901      Wt Readings from Last 3 Encounters:  11/10/21 155 lb 12.8 oz (70.7 kg)  08/03/21 159 lb (72.1 kg)  06/29/21 164 lb (74.4 kg)          No data to display            ASSESSMENT AND PLAN:  1.  Peripheral arterial disease: Moderate left calf claudication.  This seems to be due to significant stenosis in the left common femoral artery extending into the proximal portion of the SFA.  Fortunately, her symptoms improved with a walking program and her symptoms are currently not lifestyle limiting.  Recommend continuing medical therapy.    2.  Coronary artery calcifications: Currently with no anginal symptoms.  3.  Essential hypertension: Blood pressure continues to be elevated in spite of maximal dose of lisinopril and hydrochlorothiazide.  I added amlodipine 2.5 mg once daily.  If blood pressure remains uncontrolled in spite of 3 antihypertensive medications, she might require evaluation for renal artery stenosis.  4.  Hyperlipidemia: She was switched from atorvastatin to high-dose rosuvastatin and is tolerating the medication.  She will need follow-up labs in the near future.    Disposition:   FU with me in 6 months  Signed,  Kathlyn Sacramento, MD  02/08/2022 10:05 AM    [place]

## 2022-02-08 NOTE — Patient Instructions (Signed)
Medication Instructions:  START: Amlodipine 2.5 mg once daily *If you need a refill on your cardiac medications before your next appointment, please call your pharmacy*   Lab Work: None  Testing/Procedures: None   Follow-Up: At [facility], you and your health needs are our priority.  As part of our continuing mission to provide you with exceptional heart care, we have created designated Provider Care Teams.  These Care Teams include your primary Cardiologist (physician) and Advanced Practice Providers (APPs -  Physician Assistants and Nurse Practitioners) who all work together to provide you with the care you need, when you need it.  We recommend signing up for the patient portal called "MyChart".  Sign up information is provided on this After Visit Summary.  MyChart is used to connect with patients for Virtual Visits (Telemedicine).  Patients are able to view lab/test results, encounter notes, upcoming appointments, etc.  Non-urgent messages can be sent to your provider as well.   To learn more about what you can do with MyChart, go to [email or web address].    Your next appointment:   6 month(s)  The format for your next appointment:   In Person  Provider:   Kathlyn Sacramento, MD   Other Instructions   Important Information About Sugar

## 2022-02-10 ENCOUNTER — Telehealth: Payer: Self-pay | Admitting: Cardiovascular Disease

## 2022-02-10 DIAGNOSIS — I10 Essential (primary) hypertension: Secondary | ICD-10-CM

## 2022-02-10 NOTE — Telephone Encounter (Signed)
She can continue to take lisinopril hydrochlorothiazide 20-12.5 mg twice daily and amlodipine 2.5 mg once daily.  She can discard the lisinopril prescription.

## 2022-02-10 NOTE — Telephone Encounter (Signed)
The patient has been made aware to continue on the Lisinopril-HCTZ 20-12.5 mg bid and add the Amlodipine 2.5 mg once daily. Message sent to Dr. Fletcher Anon to clarify the 80 mg of Lisinopril.    Janina Mayo, MD  You; Wellington [place], MD 3 hours ago (12:04 PM)   MB Please schedule a pharmacy visit for hypertension to sort it out. They can review her medications and assess her BP to determine next steps.

## 2022-02-10 NOTE — Telephone Encounter (Signed)
Pt c/o medication issue:  1. Name of Medication:   amLODipine (NORVASC) 2.5 MG tablet    2. How are you currently taking this medication (dosage and times per day)? Take 1 tablet (2.5 mg total) by mouth daily.  3. Are you having a reaction (difficulty breathing--STAT)? No  4. What is your medication issue? Pt states that pharmacy called and implied that she may need to ask if she should be taking a "Fluid" pill while being on medication. Please advise

## 2022-02-10 NOTE — Telephone Encounter (Signed)
Returned the call to the patient. She stated that her pharmacy called her concerned that she was started on Amlodipine 2.5 mg since this can cause swelling. The patient was calling to discuss this.  When discussing the medications with the patient, there was some confusion as to what she was taking. At her appointment in June with Dr. Harl Bowie, the patient was told to stop the Lisinopril-HCTZ 20 mg-12.5 mg bid and start Lisinopril 40 and HCTZ 25 mg. The patient stated that she was already taking Lisinopril-HCTZ 20/12.5 mg twice a day which equals the same amount. She never stopped the combination medication.   At her appointment on 9/12, the patient was started on Amlodipine 2.5 mg once daily and her Lisinopril was sent in as 80 mg once daily.  The patient is confused how she should take the combination drug with Lisinopril 80 mg without also increasing her HCTZ at the same time.  She is currently taking Lisinopril-HCTZ 20-12.5 mg (twice daily-total 40-'25mg'$ ). She has not started the Amlodipine 2.5 mg yet until further clarification.

## 2022-02-10 NOTE — Telephone Encounter (Signed)
Referral placed to pharmd for Hypertension visit.

## 2022-02-15 NOTE — Addendum Note (Signed)
Addended by: Ricci Barker on: 02/15/2022 08:44 AM   Modules accepted: Orders

## 2022-05-06 LAB — LIPID PANEL
Chol/HDL Ratio: 2.4 ratio (ref 0.0–4.4)
Cholesterol, Total: 207 mg/dL — ABNORMAL HIGH (ref 100–199)
HDL: 88 mg/dL (ref >39)
LDL Chol Calc (NIH): 104 mg/dL — ABNORMAL HIGH (ref 0–99)
Triglycerides: 86 mg/dL (ref 0–149)
VLDL Cholesterol Cal: 15 mg/dL (ref 5–40)

## 2022-05-11 NOTE — Progress Notes (Signed)
Patient ID: Alexis Price                 DOB: 12-May-1946                      MRN: [id number]      HPI: Alexis Price is a 76 y.o. female referred by Dr. Phineas Inches to HTN clinic. PMH is significant for hyperlipidemia and essential hypertension, elevated CAC score (889),  Today patient has no acute concern. Patient feels little dizzy around mid morning since she started taking amlodipine other than that she has been tolerating her BP mediations well. She does not check her BP regularly, she checks occasionally and it stays in 150-170 range does not recalls exact numbers. Denies any SOB, palpitation, chest pain, headaches,or swelling Home cuff is never validated. She states she like salt. Does not add salt on cooked food though. Both her mother and father had heart problems and hypertension, siblings (all brothers) have heart problems too.   Current HTN meds: lisinopril HCTZ 40/25, amlodipine 2.5 mg   BP goal: <130/80  Family History: Father died at 5 from heart attack, 2 brothers died of heart attack (age 27 and 34) . Mother- high BP died at 64, one brother had bypass surgery- 2 months ago and the other brother had valve replacement   Social History:  Smoking: never Alcohol: 1-2 drinks/week   Diet: does not watch salt intake in her diet ; encourage to start cutting down salt while cooking and do not add salt on cooked food   Exercise: 3 miles walking  45 mins every day   Home BP readings: occasionally checks - does not recalls exact numbers but the top number runs in 150-170 range   Wt Readings from Last 3 Encounters:  05/12/22 157 lb 6.4 oz (71.4 kg)  02/08/22 158 lb 3.2 oz (71.8 kg)  11/10/21 155 lb 12.8 oz (70.7 kg)   BP Readings from Last 3 Encounters:  05/12/22 (!) 178/70  05/12/22 (!) 203/72  02/08/22 (!) 160/68   Pulse Readings from Last 3 Encounters:  05/12/22 68  05/12/22 (!) 57  02/08/22 69    Renal function: CrCl cannot be calculated (Patient's most  recent lab result is older than the maximum 21 days allowed.).  Past Medical History:  Diagnosis Date   Anemia    Aortic atherosclerosis ([place]) 06/01/2021   Arthritis    Cancer ([place])    Diverticulosis    Essential hypertension 08/24/2007   Qualifier: Diagnosis of  By: Tiney Rouge CMA, [place]    Hyperlipidemia 08/24/2007   Qualifier: Diagnosis of  By: Tiney Rouge CMA, Ellison Hughs   medicaation - simvastatin 20 mg  On for > 1 year with no adverse side affects    HYPERTENSION    Postmenopausal atrophic vaginitis 05/12/2011   Temporal arteritis ([place]) 06/20/2017    Current Outpatient Medications on File Prior to Visit  Medication Sig Dispense Refill   aspirin 81 MG EC tablet Take 1 tablet (81 mg total) by mouth daily. Swallow whole. 30 tablet 12   ezetimibe (ZETIA) 10 MG tablet Take 1 tablet (10 mg total) by mouth daily. 30 tablet 2   iron polysaccharides (NU-IRON) 150 MG capsule Take 1 capsule (150 mg total) by mouth daily. 90 capsule 1   lisinopril-hydrochlorothiazide (ZESTORETIC) 20-12.5 MG tablet Take 2 tablets by mouth daily.     rosuvastatin (CRESTOR) 40 MG tablet Take 1 tablet (40 mg total) by  mouth daily. 90 tablet 3   No current facility-administered medications on file prior to visit.    Not on File  Blood pressure (!) 203/72, pulse (!) 57, SpO2 100 %.   Essential hypertension Assessment: BP is uncontrolled (goal >130/80) in office 1st BP 204/71 mmHg upon repeating it went down to 191/83 with heart rate 57  Does not check BP at home regularly, home BP monitor not validated  Tolerates amlodipine and lisinopril HCTZ well without any side effects except mid morning mild dizziness.   Denies any SOB, palpitation, chest pain, headaches,or swelling Reiterated the importance of low salt diet, educated patient on steps of checking BP at home   Plan:  Increase Amlodipine from 2.5 mg daily to 5 mg daily Continue taking lisinopril/HCTZ 40/25 mg daily  Patient to keep record of  BP readings with heart rate and report to [place] at the next visit Bring home cuff for validation at the next appointment Will follow up in 2 weeks via phone on home BP readings if needed will switch HCTZ to chlorthalidone  Patient to see PharmD in 4 weeks for follow up   Thank you  Cammy Copa, Pharm.D [facility] A Division of [facility] [address] [place], [place] [address]  Phone: [phone]; Fax: [phone]

## 2022-05-12 ENCOUNTER — Encounter: Payer: Self-pay | Admitting: Internal Medicine

## 2022-05-12 ENCOUNTER — Ambulatory Visit (INDEPENDENT_AMBULATORY_CARE_PROVIDER_SITE_OTHER): Payer: Medicare Other | Admitting: Student

## 2022-05-12 ENCOUNTER — Ambulatory Visit: Payer: Medicare Other | Attending: Internal Medicine | Admitting: Internal Medicine

## 2022-05-12 VITALS — BP 178/70 | HR 68 | Ht 62.0 in | Wt 157.4 lb

## 2022-05-12 VITALS — BP 203/72 | HR 57

## 2022-05-12 DIAGNOSIS — I2510 Atherosclerotic heart disease of native coronary artery without angina pectoris: Secondary | ICD-10-CM

## 2022-05-12 DIAGNOSIS — I10 Essential (primary) hypertension: Secondary | ICD-10-CM

## 2022-05-12 DIAGNOSIS — E782 Mixed hyperlipidemia: Secondary | ICD-10-CM | POA: Diagnosis not present

## 2022-05-12 DIAGNOSIS — I2583 Coronary atherosclerosis due to lipid rich plaque: Secondary | ICD-10-CM | POA: Diagnosis not present

## 2022-05-12 MED ORDER — AMLODIPINE BESYLATE 5 MG PO TABS: 5.0000 mg | ORAL_TABLET | Freq: Every day | ORAL | 1 refills | Status: DC

## 2022-05-12 MED ORDER — EZETIMIBE 10 MG PO TABS: 10.0000 mg | ORAL_TABLET | Freq: Every day | ORAL | 2 refills | Status: DC

## 2022-05-12 NOTE — Patient Instructions (Signed)
Medication Instructions:  Your physician recommends that you continue on your current medications as directed. Please refer to the Current Medication list given to you today.  *If you need a refill on your cardiac medications before your next appointment, please call your pharmacy*   Lab Work: Your physician recommends that you return to have the following labs drawn in 3 months: Lipid Profile.  If you have labs (blood work) drawn today and your tests are completely normal, you will receive your results only by: [place] (if you have MyChart) OR A paper copy in the mail If you have any lab test that is abnormal or we need to change your treatment, we will call you to review the results.   Testing/Procedures: NONE   Follow-Up: At [facility], you and your health needs are our priority.  As part of our continuing mission to provide you with exceptional heart care, we have created designated Provider Care Teams.  These Care Teams include your primary Cardiologist (physician) and Advanced Practice Providers (APPs -  Physician Assistants and Nurse Practitioners) who all work together to provide you with the care you need, when you need it.  We recommend signing up for the patient portal called "MyChart".  Sign up information is provided on this After Visit Summary.  MyChart is used to connect with patients for Virtual Visits (Telemedicine).  Patients are able to view lab/test results, encounter notes, upcoming appointments, etc.  Non-urgent messages can be sent to your provider as well.   To learn more about what you can do with MyChart, go to [email or web address].    Your next appointment:   6 month(s)  The format for your next appointment:   In Person  Provider:   Janina Mayo, MD

## 2022-05-12 NOTE — Progress Notes (Signed)
Cardiology Office Note:    Date:  05/12/2022   ID:  Alexis Price, DOB Jan 15, 1946, MRN [id number]  PCP:  Biagio Borg, MD   [facility] Providers Cardiologist:  Janina Mayo, MD     Referring MD: Biagio Borg, MD   No chief complaint on file. Elevated CAC   History of Present Illness:    Alexis Price is a 76 y.o. female with a hx of HLD, claudication,  referral for CAC  She states that she had brothers (3)  that all died of MI. She told her PCP and coronary CTA was ordered.  Her CAC 889, 93rd percentile. The LAD and circ have significant CAC. She lives in [place]. She walks every morning. She has calf pain on the left with activity that has progressed. She feels tingling in the leg at night. No report of significant rest pain. No discoloration. She has known claudication on the left with abnormal ABIs. She takes aspirin and atorvastatin. LDL 110 mg/dL  in 2021. She denies chest pain or SOB. She denies orthopnea, PND. She notes some swelling in the feet at night. She denies smoking hx.   She has no cardiac hx. She's not had a stress test or echo. She has not followed with cardiology.  Interim Hx 11/10/2021 Hadn't starting the cilostazol.  Still has left leg cramping. She did not take her BP medications. Blood pressures at home; SBP closer to 140s-150s at home.  Interim Hx 05/12/2022 Presents today. Saw Dr. Fletcher Anon. Recommended walking program. No intervention needed.  Her leg pain is better.  Cardiology Studies CAC 06/01/2021 FINDINGS: Non-cardiac: See separate report from [facility]. Ascending Aorta: Normal caliber. Aortic atherosclerosis. Pericardium: Small pericardial calcification anterior to the right ventricle. Aortic Valve calcium score 166.   Coronary arteries: Normal origins.   Coronary Calcium Score:   Left main: 76   Left anterior descending artery: 521   Left circumflex artery: 228   Right coronary artery: 64   Total: 889    Percentile: 93rd for age, sex, and race matched control.   IMPRESSION: 1. Coronary calcium score of 889. This was 93rd percentile for age, gender, and race matched controls.   2. Aortic atherosclerosis.   3. Aortic Valve calcium score 166.    ABI/Duplex  Summary:  Right: Resting right ankle-brachial index is within normal range. No  evidence of significant right lower extremity arterial disease.   Left: Resting left ankle-brachial index indicates moderate left lower  extremity arterial disease.    Past Medical History:  Diagnosis Date   Anemia    Aortic atherosclerosis ([place]) 06/01/2021   Arthritis    Cancer ([place])    Diverticulosis    Essential hypertension 08/24/2007   Qualifier: Diagnosis of  By: Tiney Rouge CMA, [place]    Hyperlipidemia 08/24/2007   Qualifier: Diagnosis of  By: Tiney Rouge CMA, Ellison Hughs   medicaation - simvastatin 20 mg  On for > 1 year with no adverse side affects    HYPERTENSION    Postmenopausal atrophic vaginitis 05/12/2011   Temporal arteritis ([place]) 06/20/2017    Past Surgical History:  Procedure Laterality Date   COLON SURGERY     Emergent colectomy with creation of colosomy     flex sigmoidoscopy  1998   [place] of colostomy with salpingectomy and lysis of adhesion  04/2005   TONSILLECTOMY     age 89   TUBAL LIGATION  1976    Current Medications: Current  Meds  Medication Sig   amLODipine (NORVASC) 5 MG tablet Take 1 tablet (5 mg total) by mouth daily.   aspirin 81 MG EC tablet Take 1 tablet (81 mg total) by mouth daily. Swallow whole.   iron polysaccharides (NU-IRON) 150 MG capsule Take 1 capsule (150 mg total) by mouth daily.   lisinopril-hydrochlorothiazide (ZESTORETIC) 20-12.5 MG tablet Take 2 tablets by mouth daily.   rosuvastatin (CRESTOR) 40 MG tablet Take 1 tablet (40 mg total) by mouth daily.     Allergies:   Patient has no allergy information on record.   Social History   Socioeconomic History   Marital status:  Married    Spouse name: Not on file   Number of children: 2   Years of education: 16   Highest education level: Not on file  Occupational History   Occupation: Glass blower/designer  Tobacco Use   Smoking status: Never   Smokeless tobacco: Never  Vaping Use   Vaping Use: Never used  Substance and Sexual Activity   Alcohol use: Yes    Comment: 3-4 times per week   Drug use: No   Sexual activity: Yes    Partners: Male  Other Topics Concern   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 2 sons ' '69, '77. 4 grand-daughters. 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    Denies abuse and feels safe at home.    Social Determinants of Health   Financial Resource Strain: Low Risk  (11/23/2021)   Overall Financial Resource Strain (CARDIA)    Difficulty of Paying Living Expenses: Not hard at all  Food Insecurity: No Food Insecurity (11/23/2021)   Hunger Vital Sign    Worried About Running Out of Food in the Last Year: Never true    Ran Out of Food in the Last Year: Never true  Transportation Needs: No Transportation Needs (11/23/2021)   PRAPARE - Hydrologist (Medical): No    Lack of Transportation (Non-Medical): No  Physical Activity: Sufficiently Active (11/23/2021)   Exercise Vital Sign    Days of Exercise per Week: 7 days    Minutes of Exercise per Session: 30 min  Stress: No Stress Concern Present (11/23/2021)   Home    Feeling of Stress : Not at all  Social Connections: [place] (11/23/2021)   Social Connection and Isolation Panel [NHANES]    Frequency of Communication with Friends and Family: More than three times a week    Frequency of Social Gatherings with Friends and Family: More than three times a week    Attends Religious Services: More than 4 times per year    Active Member of [employer] or Organizations: No    Attends Arts administrator: More than 4 times per year    Marital Status: Married     Family History: The patient's  brothers (53)  that all died of MI. family history includes Coronary artery disease in her mother; Heart disease in her mother; Hypertension in her mother. Her father died of MI at 36.  ROS:   Please see the history of present illness.     All other systems reviewed and are negative.  EKGs/Labs/Other Studies Reviewed:    The following studies were reviewed today:   EKG:  EKG is  ordered today.  The ekg ordered today demonstrates   NSR  11/10/2021-sinus bradycardia HR 59  bpm  Recent Labs: No results found for requested labs within last 365 days.   Recent Lipid Panel    Component Value Date/Time   CHOL 207 (H) 05/05/2022 1010   TRIG 86 05/05/2022 1010   HDL 88 05/05/2022 1010   CHOLHDL 2.4 05/05/2022 1010   CHOLHDL 4 05/04/2021 0901   VLDL 41.4 (H) 05/04/2021 0901   LDLCALC 104 (H) 05/05/2022 1010   LDLCALC 110 (H) 08/23/2019 1636   LDLDIRECT 140.0 05/04/2021 0901     Risk Assessment/Calculations:           Physical Exam:    VS:  Vitals:   05/12/22 1107  BP: (!) 178/70  Pulse: 68  SpO2: 96%    Wt Readings from Last 3 Encounters:  05/12/22 157 lb 6.4 oz (71.4 kg)  02/08/22 158 lb 3.2 oz (71.8 kg)  11/10/21 155 lb 12.8 oz (70.7 kg)     GEN:  Well nourished, well developed in no acute distress HEENT: Normal NECK: No JVD; No carotid bruits LYMPHATICS: No lymphadenopathy CARDIAC: RRR, III/VI SEM RUSB (no significant AS), rubs, gallops Vasc: 2+ radial pulses BL RESPIRATORY:  Clear to auscultation without rales, wheezing or rhonchi  ABDOMEN: Soft, non-tender, non-distended MUSCULOSKELETAL:  No edema; No deformity  SKIN: Warm and well perfused  NEUROLOGIC:  Alert and oriented x 3 PSYCHIATRIC:  Normal affect   ASSESSMENT:    #Elevated CAC: She's at increased risk of CAD. She is asymptomatic. Goal is for CVD risk mitigation. She has elevated CAC 93rd  percentile for age. She is > 75 ; only subgroup analysis data for this group in terms of benefits of statin therapy. Crestor did show reduction in ASCVD risk in this age group compared to placebo. A1c 5.9%. If she develops symptoms of CP/SOB can consider LHC considering she is high risk. - stopped atorvastatin - continue  crestor 40 mg daily ; LDL 104, recommended starting zetia 10 mg daily Goal < 70 -TTE EF 60-70,  normal RV function, no significant valve disease.  PAD: + claudication. Left SFA/popliteal artery occlusion. No signs of acute or chronic limb ischemia. She had progressive symptoms and was referred to Dr. Fletcher Anon. Recommended continued walking program.  - continue asa - continue on statin - continue exercise - continue cilostazol 50 mg BID   HTN: not optimally controlled 178/70. There's confusion. Just changed to norvasc 5 mg daily , lisinopril-HCTz 40/25 q AM  Updated her pharmacy to [facility]. For now all meds sent to [facility],  PLAN:    In order of problems listed above:   Lipids 3 months fasting Follow up 6 months         Medication Adjustments/Labs and Tests Ordered: Current medicines are reviewed at length with the patient today.  Concerns regarding medicines are outlined above.  Orders Placed This Encounter  Procedures   Lipid panel   Meds ordered this encounter  Medications   ezetimibe (ZETIA) 10 MG tablet    Sig: Take 1 tablet (10 mg total) by mouth daily.    Dispense:  30 tablet    Refill:  2    Patient Instructions  Medication Instructions:  Your physician recommends that you continue on your current medications as directed. Please refer to the Current Medication list given to you today.  *If you need a refill on your cardiac medications before your next appointment, please call your pharmacy*   Lab Work: Your physician recommends that you return to have the following labs drawn  in 3 months: Lipid Profile.  If you have labs  (blood work) drawn today and your tests are completely normal, you will receive your results only by: [place] (if you have MyChart) OR A paper copy in the mail If you have any lab test that is abnormal or we need to change your treatment, we will call you to review the results.   Testing/Procedures: NONE   Follow-Up: At [facility], you and your health needs are our priority.  As part of our continuing mission to provide you with exceptional heart care, we have created designated Provider Care Teams.  These Care Teams include your primary Cardiologist (physician) and Advanced Practice Providers (APPs -  Physician Assistants and Nurse Practitioners) who all work together to provide you with the care you need, when you need it.  We recommend signing up for the patient portal called "MyChart".  Sign up information is provided on this After Visit Summary.  MyChart is used to connect with patients for Virtual Visits (Telemedicine).  Patients are able to view lab/test results, encounter notes, upcoming appointments, etc.  Non-urgent messages can be sent to your provider as well.   To learn more about what you can do with MyChart, go to [email or web address].    Your next appointment:   6 month(s)  The format for your next appointment:   In Person  Provider:   Janina Mayo, MD     Signed, Janina Mayo, MD  05/12/2022 12:51 PM    [employer]

## 2022-05-12 NOTE — Assessment & Plan Note (Addendum)
Assessment: BP is uncontrolled (goal <130/80) in office 1st BP 204/71 mmHg upon repeating it went down to 191/83 with heart rate 57  Does not check BP at home regularly, home BP monitor not validated  Tolerates amlodipine and lisinopril HCTZ well without any side effects except mid morning mild dizziness.   Denies any SOB, palpitation, chest pain, headaches,or swelling Reiterated the importance of low salt diet, educated patient on steps of checking BP at home   Plan:  Increase Amlodipine from 2.5 mg daily to 5 mg daily Continue taking lisinopril/HCTZ 40/25 mg daily  Patient to keep record of BP readings with heart rate and report to [place] at the next visit Bring home cuff for validation at the next appointment Will follow up in 2 weeks via phone on home BP readings if needed will switch HCTZ to chlorthalidone  Patient to see PharmD in 4 weeks for follow up

## 2022-05-12 NOTE — Patient Instructions (Signed)
Changes made by your pharmacist Cammy Copa, PharmD at today's visit:    Instructions/Changes  (what do you need to do) Your Notes  (what you did and when you did it)  Increase amlodipine from 2.5 mg to 5 mg (take 2 tablets of 2.5 mg)   2.   Continue taking lisinopril 40/25 mg every morning (2 tablets of 20/12.5 mg)    3. Cut down on salt intake    Bring all of your meds, your BP cuff and your record of home blood pressures to your next appointment.    HOW TO TAKE YOUR BLOOD PRESSURE AT HOME  Rest 5 minutes before taking your blood pressure.  Don't smoke or drink caffeinated beverages for at least 30 minutes before. Take your blood pressure before (not after) you eat. Sit comfortably with your back supported and both feet on the floor (don't cross your legs). Elevate your arm to heart level on a table or a desk. Use the proper sized cuff. It should fit smoothly and snugly around your bare upper arm. There should be enough room to slip a fingertip under the cuff. The bottom edge of the cuff should be 1 inch above the crease of the elbow. Ideally, take 3 measurements at one sitting and record the average.  Important lifestyle changes to control high blood pressure  Intervention  Effect on the BP  Lose extra pounds and watch your waistline Weight loss is one of the most effective lifestyle changes for controlling blood pressure. If you're overweight or obese, losing even a small amount of weight can help reduce blood pressure. Blood pressure might go down by about 1 millimeter of mercury (mm Hg) with each kilogram (about 2.2 pounds) of weight lost.  Exercise regularly As a general goal, aim for at least 30 minutes of moderate physical activity every day. Regular physical activity can lower high blood pressure by about 5 to 8 mm Hg.  Eat a healthy diet Eating a diet rich in whole grains, fruits, vegetables, and low-fat dairy products and low in saturated fat and cholesterol. A healthy  diet can lower high blood pressure by up to 11 mm Hg.  Reduce salt (sodium) in your diet Even a small reduction of sodium in the diet can improve heart health and reduce high blood pressure by about 5 to 6 mm Hg.  Limit alcohol One drink equals 12 ounces of beer, 5 ounces of wine, or 1.5 ounces of 80-proof liquor.  Limiting alcohol to less than one drink a day for women or two drinks a day for men can help lower blood pressure by about 4 mm Hg.   If you have any questions or concerns please use My Chart to send questions or call the office at [phone]

## 2022-05-25 ENCOUNTER — Telehealth: Payer: Self-pay | Admitting: Pharmacist

## 2022-06-03 ENCOUNTER — Encounter: Payer: Self-pay | Admitting: Hematology and Oncology

## 2022-06-03 NOTE — Telephone Encounter (Signed)
Spoke to patient via phone. Reports she no longer have pain and she has stopped taking ezetimibe. Home BP ~128/65 range. However home BP cuff is not validated so encouraged her to bring her home BP monitor at the next OV on 06/10/2022.

## 2022-06-10 ENCOUNTER — Encounter: Payer: Self-pay | Admitting: Pharmacist

## 2022-06-10 ENCOUNTER — Ambulatory Visit: Payer: Medicare Other | Attending: Cardiology | Admitting: Pharmacist

## 2022-06-10 VITALS — BP 179/66 | HR 64

## 2022-06-10 DIAGNOSIS — I10 Essential (primary) hypertension: Secondary | ICD-10-CM | POA: Diagnosis present

## 2022-06-10 MED ORDER — LOSARTAN POTASSIUM-HCTZ 100-25 MG PO TABS: 1.0000 | ORAL_TABLET | Freq: Every day | ORAL | 1 refills | Status: DC

## 2022-06-10 NOTE — Patient Instructions (Addendum)
It was nice meeting you today  We would like your blood pressure to be less than 130/80  Your home readings look great. Please send me some readings from this weekend to make sure it is staying at a normal range  I have discontinued all of your lisinopril and hydrochlorothiazide prescriptions  To make things easier, we will switch to losartan/hydrochlorothiazide 100/25 once a day in the morning  Karren Cobble, PharmD, [place], [place], [place], [place], [place], [address] Phone: [phone] , Fax: [phone]

## 2022-06-10 NOTE — Progress Notes (Signed)
Patient ID: SKILAR MARCOU                 DOB: 02/20/1946                      MRN: [id number]     HPI: Alexis Price is a 77 y.o. female referred by Dr. Harl Bowie to HTN clinic. PMH is significant for HTN, CAD, and HLD. At last visit with Dr Harl Bowie patient was extremely hypertensive at 203/72.  Patient began checking her BP at home more regurarly and readings began coming down. Managed on amlodipine '5mg'$  daily and lisinopril/HCTZ 40/'25mg'$  daily.  Patient presents today in good spirits. Lives in [place] but travels to [place] for medical appointments. Husband is seen by [facility] here as well and she uses the [employer] [facility]. Is currently staying with children in [place].   Patient took BP in room: 192/76.  Much higher than home readings. Home reading yesterday was normal at 128/70. Patient reports she took medications this morning. Brought out medication bottle which was Lisinopril '40mg'$  take 2 tablets by mouth once daily.  Current HTN meds:  Amlodipine '5mg'$  daily Lisinopril '80mg'$  daily (supposed to be on lisinopril/HCTZ 40/25)  BP goal: <130/80  Wt Readings from Last 3 Encounters:  05/12/22 157 lb 6.4 oz (71.4 kg)  02/08/22 158 lb 3.2 oz (71.8 kg)  11/10/21 155 lb 12.8 oz (70.7 kg)   BP Readings from Last 3 Encounters:  06/10/22 (!) 179/66  05/12/22 (!) 178/70  05/12/22 (!) 203/72   Pulse Readings from Last 3 Encounters:  06/10/22 64  05/12/22 68  05/12/22 (!) 57    Renal function: CrCl cannot be calculated (Patient's most recent lab result is older than the maximum 21 days allowed.).  Past Medical History:  Diagnosis Date   Anemia    Aortic atherosclerosis ([place]) 06/01/2021   Arthritis    Cancer ([place])    Diverticulosis    Essential hypertension 08/24/2007   Qualifier: Diagnosis of  By: Tiney Rouge CMA, [place]    Hyperlipidemia 08/24/2007   Qualifier: Diagnosis of  By: Tiney Rouge CMA, Ellison Hughs   medicaation - simvastatin 20 mg  On  for > 1 year with no adverse side affects    HYPERTENSION    Postmenopausal atrophic vaginitis 05/12/2011   Temporal arteritis ([place]) 06/20/2017    Current Outpatient Medications on File Prior to Visit  Medication Sig Dispense Refill   amLODipine (NORVASC) 5 MG tablet Take 1 tablet (5 mg total) by mouth daily. 90 tablet 1   aspirin 81 MG EC tablet Take 1 tablet (81 mg total) by mouth daily. Swallow whole. 30 tablet 12   ezetimibe (ZETIA) 10 MG tablet Take 1 tablet (10 mg total) by mouth daily. 30 tablet 2   iron polysaccharides (NU-IRON) 150 MG capsule Take 1 capsule (150 mg total) by mouth daily. 90 capsule 1   lisinopril-hydrochlorothiazide (ZESTORETIC) 20-12.5 MG tablet Take 2 tablets by mouth daily.     rosuvastatin (CRESTOR) 40 MG tablet Take 1 tablet (40 mg total) by mouth daily. 90 tablet 3   No current facility-administered medications on file prior to visit.    Not on File   Assessment/Plan:  1. Hypertension -  HYPERTENSION CONTROL Vitals:   06/10/22 1549 06/10/22 1553  BP: (!) 188/69 (!) 179/66    The patient's blood pressure is elevated above target today.  In order to address the patient's  elevated BP: The blood pressure is usually elevated in clinic.  Blood pressures monitored at home have been optimal.    Patient BP in room 188/69 which is above goal of <130/80 however home readings are normal. Patient likely has some white coat HTN. BP cuff validated in room and patient uses proper technique.  Unfortunately patient's prescription was written and filled incorrectly. Not currently taking any HCTZ and taking over max dose of lisinopril. Looked back and there was discrepancies between multiple lisinopril and lisinopril/HCTZ prescriptions. Advised patient it would be prudent to check renal function since it has not been checked recently and patient is agreeable.  To avoid further confusion, discontinued all lisinopril and HCTZ prescriptions in chart and sent message to  pharmacy. Changed to losartan/HCTZ 100/25 which patient will begin when she returns home to [facility].  Sent patient a Pharmacist, community message and asked her to respond with home BP readings through the weekend to confirm white coat HTN. Patient agreeable.  Stop lisinopril Start losartan/HCTZ 100/25 daily Continue amlodipine '5mg'$  daily Check BMP due to high lisinopril dosage  Karren Cobble, PharmD, [place], [place] [place], [place], [place], [place], [address] Phone: [phone], Fax: [phone]

## 2022-06-11 LAB — BASIC METABOLIC PANEL
BUN/Creatinine Ratio: 23 (ref 12–28)
BUN: 19 mg/dL (ref 8–27)
CO2: 23 mmol/L (ref 20–29)
Calcium: 10.5 mg/dL — ABNORMAL HIGH (ref 8.7–10.3)
Chloride: 99 mmol/L (ref 96–106)
Creatinine, Ser: 0.82 mg/dL (ref 0.57–1.00)
Glucose: 91 mg/dL (ref 70–99)
Potassium: 4.8 mmol/L (ref 3.5–5.2)
Sodium: 138 mmol/L (ref 134–144)
eGFR: 74 mL/min/{1.73_m2} (ref >59)

## 2022-06-13 ENCOUNTER — Other Ambulatory Visit: Payer: Self-pay | Admitting: Internal Medicine

## 2022-06-17 ENCOUNTER — Other Ambulatory Visit: Payer: Self-pay | Admitting: Internal Medicine

## 2022-06-21 ENCOUNTER — Other Ambulatory Visit: Payer: Self-pay | Admitting: *Deleted

## 2022-06-21 MED ORDER — ROSUVASTATIN CALCIUM 40 MG PO TABS: 40.0000 mg | ORAL_TABLET | Freq: Every day | ORAL | 3 refills | Status: DC

## 2022-08-09 ENCOUNTER — Encounter: Payer: Self-pay | Admitting: Cardiovascular Disease

## 2022-08-09 ENCOUNTER — Ambulatory Visit: Payer: Medicare Other | Attending: Cardiovascular Disease | Admitting: Cardiovascular Disease

## 2022-08-09 VITALS — BP 144/78 | HR 58 | Ht 62.0 in | Wt 150.0 lb

## 2022-08-09 DIAGNOSIS — E785 Hyperlipidemia, unspecified: Secondary | ICD-10-CM | POA: Diagnosis present

## 2022-08-09 DIAGNOSIS — I739 Peripheral vascular disease, unspecified: Secondary | ICD-10-CM | POA: Diagnosis not present

## 2022-08-09 DIAGNOSIS — I2510 Atherosclerotic heart disease of native coronary artery without angina pectoris: Secondary | ICD-10-CM | POA: Diagnosis present

## 2022-08-09 DIAGNOSIS — I10 Essential (primary) hypertension: Secondary | ICD-10-CM | POA: Diagnosis present

## 2022-08-09 NOTE — Progress Notes (Signed)
Cardiology Office Note   Date:  08/09/2022   ID:  Alexis, Price Aug 27, 1945, MRN [id number]  PCP:  Biagio Borg, MD  Cardiologist: Dr. Phineas Inches  Chief Complaint  Patient presents with   Follow-up    6 months.   Headache      History of Present Illness: Alexis Price is a 77 y.o. female who is here today for follow-up visit regarding peripheral arterial disease.    She has known history of hyperlipidemia and essential hypertension.  She has family history of coronary artery disease.  She had coronary calcium score which was elevated at 889.  She is a lifelong non-smoker and she is nondiabetic. She is followed for left calf claudication.  This is due to severe stenosis in the left common femoral artery extending into the proximal portion of the SFA.  Her symptoms improved significantly with a walking program.  She is currently able to walk 5 or 6 miles per day with minimal limitations.  She only has mild discomfort in the left calf that is not lifestyle limiting.  No chest pain or shortness of breath.  Past Medical History:  Diagnosis Date   Anemia    Aortic atherosclerosis ([place]) 06/01/2021   Arthritis    Cancer ([place])    Diverticulosis    Essential hypertension 08/24/2007   Qualifier: Diagnosis of  By: Tiney Rouge CMA, [place]    Hyperlipidemia 08/24/2007   Qualifier: Diagnosis of  By: Tiney Rouge CMA, Ellison Hughs   medicaation - simvastatin 20 mg  On for > 1 year with no adverse side affects    HYPERTENSION    Postmenopausal atrophic vaginitis 05/12/2011   Temporal arteritis ([place]) 06/20/2017    Past Surgical History:  Procedure Laterality Date   COLON SURGERY     Emergent colectomy with creation of colosomy     flex sigmoidoscopy  1998   [place] of colostomy with salpingectomy and lysis of adhesion  04/2005   TONSILLECTOMY     age 37   TUBAL LIGATION  1976     Current Outpatient Medications  Medication Sig Dispense Refill   amLODipine  (NORVASC) 5 MG tablet Take 1 tablet (5 mg total) by mouth daily. 90 tablet 1   aspirin 81 MG EC tablet Take 1 tablet (81 mg total) by mouth daily. Swallow whole. 30 tablet 12   iron polysaccharides (NU-IRON) 150 MG capsule Take 1 capsule (150 mg total) by mouth daily. 90 capsule 1   losartan-hydrochlorothiazide (HYZAAR) 100-25 MG tablet Take 1 tablet by mouth daily. 90 tablet 1   rosuvastatin (CRESTOR) 40 MG tablet Take 1 tablet (40 mg total) by mouth daily. 90 tablet 3   No current facility-administered medications for this visit.    Allergies:   Patient has no allergy information on record.    Social History:  The patient  reports that she has never smoked. She has never used smokeless tobacco. She reports current alcohol use. She reports that she does not use drugs.   Family History:  The patient's family history includes Coronary artery disease in her mother; Heart disease in her mother; Hypertension in her mother.    ROS:  Please see the history of present illness.   Otherwise, review of systems are positive for none.   All other systems are reviewed and negative.    PHYSICAL EXAM: VS:  BP (!) 144/78 (BP Location: Right Arm, Patient Position: Sitting, Cuff Size: Normal)   Pulse Marland Kitchen)  58   Ht '5\' 2"'$  (1.575 m)   Wt 150 lb (68 kg)   BMI 27.44 kg/m  , BMI Body mass index is 27.44 kg/m. GEN: Well nourished, well developed, in no acute distress  HEENT: normal  Neck: no JVD, carotid bruits, or masses Cardiac: RRR; no rubs, or gallops,no edema .  2 /6 systolic murmur in the aortic area with radiation to carotid arteries. Respiratory:  clear to auscultation bilaterally, normal work of breathing GI: soft, nontender, nondistended, + BS MS: no deformity or atrophy  Skin: warm and dry, no rash Neuro:  Strength and sensation are intact Psych: euthymic mood, full affect Vascular: Distal pulses are palpable on the right side but not the left.   EKG:  EKG is ordered today. EKG showed sinus  bradycardia with no significant ST or T wave changes.   Recent Labs: 06/10/2022: BUN 19; Creatinine, Ser 0.82; Potassium 4.8; Sodium 138    Lipid Panel    Component Value Date/Time   CHOL 207 (H) 05/05/2022 1010   TRIG 86 05/05/2022 1010   HDL 88 05/05/2022 1010   CHOLHDL 2.4 05/05/2022 1010   CHOLHDL 4 05/04/2021 0901   VLDL 41.4 (H) 05/04/2021 0901   LDLCALC 104 (H) 05/05/2022 1010   LDLCALC 110 (H) 08/23/2019 1636   LDLDIRECT 140.0 05/04/2021 0901      Wt Readings from Last 3 Encounters:  08/09/22 150 lb (68 kg)  05/12/22 157 lb 6.4 oz (71.4 kg)  02/08/22 158 lb 3.2 oz (71.8 kg)          No data to display            ASSESSMENT AND PLAN:  1.  Peripheral arterial disease: She has left leg claudication which has improved and currently is mild.  This is due to significant disease into the left common femoral artery extending into the SFA.  Her symptoms are clearly not lifestyle limiting at this point.  I recommend continued medical therapy.    2.  Coronary artery calcifications: Currently with no anginal symptoms.  3.  Essential hypertension: Amlodipine was added during last visit.  In addition, lisinopril was switched to losartan and she continues to take hydrochlorothiazide.  Her blood pressure improved significantly.  4.  Hyperlipidemia: Continue high-dose rosuvastatin.  She was prescribed ezetimibe by Dr. Harl Bowie in December but did not tolerate the medication due to some back pain.  If LDL remains above 70, consider a PCSK9 inhibitor.   Disposition:   FU with me in 12 months  Signed,  Kathlyn Sacramento, MD  08/09/2022 9:44 AM    [place]

## 2022-08-09 NOTE — Patient Instructions (Signed)
Medication Instructions:  No changes *If you need a refill on your cardiac medications before your next appointment, please call your pharmacy*   Lab Work: None ordered If you have labs (blood work) drawn today and your tests are completely normal, you will receive your results only by: MyChart Message (if you have MyChart) OR A paper copy in the mail If you have any lab test that is abnormal or we need to change your treatment, we will call you to review the results.   Testing/Procedures: None ordered   Follow-Up: At [facility], you and your health needs are our priority.  As part of our continuing mission to provide you with exceptional heart care, we have created designated Provider Care Teams.  These Care Teams include your primary Cardiologist (physician) and Advanced Practice Providers (APPs -  Physician Assistants and Nurse Practitioners) who all work together to provide you with the care you need, when you need it.  We recommend signing up for the patient portal called "MyChart".  Sign up information is provided on this After Visit Summary.  MyChart is used to connect with patients for Virtual Visits (Telemedicine).  Patients are able to view lab/test results, encounter notes, upcoming appointments, etc.  Non-urgent messages can be sent to your provider as well.   To learn more about what you can do with MyChart, go to [email or web address].    Your next appointment:   12 month(s)  Provider:   Dr. Arida  

## 2022-09-23 ENCOUNTER — Encounter: Payer: Self-pay | Admitting: Hematology and Oncology

## 2022-11-11 ENCOUNTER — Ambulatory Visit: Payer: Medicare Other | Admitting: Internal Medicine

## 2022-12-12 ENCOUNTER — Ambulatory Visit (INDEPENDENT_AMBULATORY_CARE_PROVIDER_SITE_OTHER): Payer: Medicare Other | Admitting: Internal Medicine

## 2022-12-12 VITALS — BP 138/80 | HR 60 | Temp 98.2°F | Ht 62.0 in | Wt 144.2 lb

## 2022-12-12 DIAGNOSIS — I10 Essential (primary) hypertension: Secondary | ICD-10-CM | POA: Diagnosis not present

## 2022-12-12 DIAGNOSIS — E559 Vitamin D deficiency, unspecified: Secondary | ICD-10-CM | POA: Diagnosis not present

## 2022-12-12 DIAGNOSIS — D500 Iron deficiency anemia secondary to blood loss (chronic): Secondary | ICD-10-CM

## 2022-12-12 DIAGNOSIS — R739 Hyperglycemia, unspecified: Secondary | ICD-10-CM | POA: Diagnosis not present

## 2022-12-12 DIAGNOSIS — E538 Deficiency of other specified B group vitamins: Secondary | ICD-10-CM

## 2022-12-12 DIAGNOSIS — E782 Mixed hyperlipidemia: Secondary | ICD-10-CM | POA: Diagnosis not present

## 2022-12-12 LAB — URINALYSIS, ROUTINE W REFLEX MICROSCOPIC
Bilirubin Urine: NEGATIVE
Hgb urine dipstick: NEGATIVE
Ketones, ur: NEGATIVE
Leukocytes,Ua: NEGATIVE
Nitrite: NEGATIVE
RBC / HPF: NONE SEEN (ref 0–?)
Specific Gravity, Urine: 1.01 (ref 1.000–1.030)
Total Protein, Urine: NEGATIVE
Urine Glucose: NEGATIVE
Urobilinogen, UA: 0.2 (ref 0.0–1.0)
pH: 7 (ref 5.0–8.0)

## 2022-12-12 LAB — LIPID PANEL
Cholesterol: 200 mg/dL (ref 0–200)
HDL: 89.3 mg/dL (ref >39.00)
LDL Cholesterol: 92 mg/dL (ref 0–99)
NonHDL: 110.52
Total CHOL/HDL Ratio: 2
Triglycerides: 95 mg/dL (ref 0.0–149.0)
VLDL: 19 mg/dL (ref 0.0–40.0)

## 2022-12-12 LAB — CBC WITH DIFFERENTIAL/PLATELET
Basophils Absolute: 0 10*3/uL (ref 0.0–0.1)
Basophils Relative: 0.9 % (ref 0.0–3.0)
Eosinophils Absolute: 0.1 10*3/uL (ref 0.0–0.7)
Eosinophils Relative: 2.3 % (ref 0.0–5.0)
HCT: 36 % (ref 36.0–46.0)
Hemoglobin: 12.2 g/dL (ref 12.0–15.0)
Lymphocytes Relative: 23.3 % (ref 12.0–46.0)
Lymphs Abs: 1.2 10*3/uL (ref 0.7–4.0)
MCHC: 33.9 g/dL (ref 30.0–36.0)
MCV: 93.4 fl (ref 78.0–100.0)
Monocytes Absolute: 0.6 10*3/uL (ref 0.1–1.0)
Monocytes Relative: 11.8 % (ref 3.0–12.0)
Neutro Abs: 3.3 10*3/uL (ref 1.4–7.7)
Neutrophils Relative %: 61.7 % (ref 43.0–77.0)
Platelets: 274 10*3/uL (ref 150.0–400.0)
RBC: 3.86 Mil/uL — ABNORMAL LOW (ref 3.87–5.11)
RDW: 12.6 % (ref 11.5–15.5)
WBC: 5.3 10*3/uL (ref 4.0–10.5)

## 2022-12-12 LAB — TSH: TSH: 1.85 u[IU]/mL (ref 0.35–5.50)

## 2022-12-12 LAB — MICROALBUMIN / CREATININE URINE RATIO
Creatinine,U: 58.4 mg/dL
Microalb Creat Ratio: 1.2 mg/g (ref 0.0–30.0)
Microalb, Ur: <0.7 mg/dL (ref 0.0–1.9)

## 2022-12-12 LAB — HEPATIC FUNCTION PANEL
ALT: 17 U/L (ref 0–35)
AST: 24 U/L (ref 0–37)
Albumin: 4.7 g/dL (ref 3.5–5.2)
Alkaline Phosphatase: 53 U/L (ref 39–117)
Bilirubin, Direct: 0.1 mg/dL (ref 0.0–0.3)
Total Bilirubin: 0.7 mg/dL (ref 0.2–1.2)
Total Protein: 7.2 g/dL (ref 6.0–8.3)

## 2022-12-12 LAB — BASIC METABOLIC PANEL
BUN: 19 mg/dL (ref 6–23)
CO2: 28 mEq/L (ref 19–32)
Calcium: 10.6 mg/dL — ABNORMAL HIGH (ref 8.4–10.5)
Chloride: 95 mEq/L — ABNORMAL LOW (ref 96–112)
Creatinine, Ser: 0.8 mg/dL (ref 0.40–1.20)
GFR: 71.11 mL/min (ref >60.00)
Glucose, Bld: 102 mg/dL — ABNORMAL HIGH (ref 70–99)
Potassium: 4.6 mEq/L (ref 3.5–5.1)
Sodium: 133 mEq/L — ABNORMAL LOW (ref 135–145)

## 2022-12-12 LAB — VITAMIN D 25 HYDROXY (VIT D DEFICIENCY, FRACTURES): VITD: 47.63 ng/mL (ref 30.00–100.00)

## 2022-12-12 LAB — HEMOGLOBIN A1C: Hgb A1c MFr Bld: 6 % (ref 4.6–6.5)

## 2022-12-12 LAB — VITAMIN B12: Vitamin B-12: 1307 pg/mL — ABNORMAL HIGH (ref 211–911)

## 2022-12-12 NOTE — Progress Notes (Unsigned)
Patient ID: Alexis Price, female   DOB: [birth year], 77 y.o.   MRN: [id number]         Chief Complaint:: yearly exam       HPI:  Alexis Price is a 77 y.o. female here overall doing ok.  Pt denies chest pain, increased sob or doe, wheezing, orthopnea, PND, increased LE swelling, palpitations, dizziness or syncope.   Pt denies polydipsia, polyuria, or new focal neuro s/s.    Pt denies fever, wt loss, night sweats, loss of appetite, or other constitutional symptoms   To see cardiology next [facility].   Wt Readings from Last 3 Encounters:  12/12/22 144 lb 3.2 oz (65.4 kg)  08/09/22 150 lb (68 kg)  05/12/22 157 lb 6.4 oz (71.4 kg)   BP Readings from Last 3 Encounters:  12/12/22 138/80  08/09/22 (!) 144/78  06/10/22 (!) 179/66   Immunization History  Administered Date(s) Administered   Fluad Quad(high Dose 65+) 02/20/2020   Influenza Split 05/11/2011   Influenza, High Dose Seasonal PF 03/06/2013   Influenza, Seasonal, Injecte, Preservative Fre 06/26/2012   PFIZER(Purple Top)SARS-COV-2 Vaccination 08/22/2019, 09/12/2019   Pneumococcal Conjugate-13 09/20/2016   Pneumococcal Polysaccharide-23 08/22/2018   Tetanus 06/26/2012   Health Maintenance Due  Topic Date Due   Zoster Vaccines- Shingrix (1 of 2) Never done   DEXA SCAN  Never done   DTaP/Tdap/Td (1 - Tdap) 06/27/2012   COVID-19 Vaccine (3 - 2023-24 season) 01/28/2022   Medicare Annual Wellness (AWV)  11/24/2022      Past Medical History:  Diagnosis Date   Anemia    Aortic atherosclerosis ([facility]) 06/01/2021   Arthritis    Cancer ([facility])    Diverticulosis    Essential hypertension 08/24/2007   Qualifier: Diagnosis of  By: Briscoe Burns CMA, Alvy Beal     HYPERLIPIDEMIA    Hyperlipidemia 08/24/2007   Qualifier: Diagnosis of  By: Briscoe Burns CMA, Alvy Beal   medicaation - simvastatin 20 mg  On for > 1 year with no adverse side affects    HYPERTENSION    Postmenopausal atrophic vaginitis 05/12/2011   Temporal arteritis ([facility]) 06/20/2017   Past  Surgical History:  Procedure Laterality Date   COLON SURGERY     Emergent colectomy with creation of colosomy     flex sigmoidoscopy  1998   [place] of colostomy with salpingectomy and lysis of adhesion  04/2005   TONSILLECTOMY     age 55   TUBAL LIGATION  1976    reports that she has never smoked. She has never used smokeless tobacco. She reports current alcohol use. She reports that she does not use drugs. family history includes Coronary artery disease in her mother; Heart disease in her mother; Hypertension in her mother. Not on File Current Outpatient Medications on File Prior to Visit  Medication Sig Dispense Refill   amLODipine (NORVASC) 5 MG tablet Take 1 tablet (5 mg total) by mouth daily. 90 tablet 1   aspirin 81 MG EC tablet Take 1 tablet (81 mg total) by mouth daily. Swallow whole. 30 tablet 12   iron polysaccharides (NU-IRON) 150 MG capsule Take 1 capsule (150 mg total) by mouth daily. 90 capsule 1   rosuvastatin (CRESTOR) 40 MG tablet Take 1 tablet (40 mg total) by mouth daily. 90 tablet 3   No current facility-administered medications on file prior to visit.        ROS:  All others reviewed and negative.  Objective        PE:  BP  138/80 (BP Location: Left Arm, Patient Position: Sitting, Cuff Size: Normal)   Pulse 60   Temp 98.2 F (36.8 C) (Oral)   Ht 5\' 2"  (1.575 m)   Wt 144 lb 3.2 oz (65.4 kg)   SpO2 96%   BMI 26.37 kg/m                 Constitutional: Pt appears in NAD               HENT: Head: NCAT.                Right Ear: External ear normal.                 Left Ear: External ear normal.                Eyes: . Pupils are equal, round, and reactive to light. Conjunctivae and EOM are normal               Nose: without d/c or deformity               Neck: Neck supple. Gross normal ROM               Cardiovascular: Normal rate and regular rhythm.                 Pulmonary/Chest: Effort normal and breath sounds without rales or wheezing.                 Abd:  Soft, NT, ND, + BS, no organomegaly               Neurological: Pt is alert. At baseline orientation, motor grossly intact               Skin: Skin is warm. No rashes, no other new lesions, LE edema - none               Psychiatric: Pt behavior is normal without agitation   Micro: none  Cardiac tracings I have personally interpreted today:  none  Pertinent Radiological findings (summarize): none   Lab Results  Component Value Date   WBC 5.3 12/12/2022   HGB 12.2 12/12/2022   HCT 36.0 12/12/2022   PLT 274.0 12/12/2022   GLUCOSE 102 (H) 12/12/2022   CHOL 200 12/12/2022   TRIG 95.0 12/12/2022   HDL 89.30 12/12/2022   LDLDIRECT 140.0 05/04/2021   LDLCALC 92 12/12/2022   ALT 17 12/12/2022   AST 24 12/12/2022   NA 133 (L) 12/12/2022   K 4.6 12/12/2022   CL 95 (L) 12/12/2022   CREATININE 0.80 12/12/2022   BUN 19 12/12/2022   CO2 28 12/12/2022   TSH 1.85 12/12/2022   HGBA1C 6.0 12/12/2022   MICROALBUR <0.7 12/12/2022   Assessment/Plan:  Alexis Price is a 77 y.o. White or Caucasian [1] female with  has a past medical history of Anemia, Aortic atherosclerosis (H[facility]) (06/01/2021), Arthritis, Cancer (HCC), Diverticulosis, Essential hypertension (08/24/2007), HYPERLIPIDEMIA, Hyperlipidemia (08/24/2007), HYPERTENSION, Postmenopausal atrophic vaginitis (05/12/2011), and Temporal arteritis ([facility]) (06/20/2017).  Essential hypertension BP Readings from Last 3 Encounters:  12/12/22 138/80  08/09/22 (!) 144/78  06/10/22 (!) 179/66   Stable, pt to continue medical treatment amlod 5 every day, hyzaar 100 25 qd   Hyperglycemia Lab Results  Component Value Date   HGBA1C 6.0 12/12/2022   Stable, pt to continue current medical treatment  - diet, wt control   Hyperlipidemia Lab Results  Component Value Date  LDLCALC 92 12/12/2022   Unocntrolled but improved, pt to continue current statin crestor 40 every day, declines add zetia for now, may consider repatha if not better with  diet per pt   Iron deficiency anemia due to chronic blood loss Also for iron lab f/u  Vitamin D deficiency Last vitamin D Lab Results  Component Value Date   VD25OH 47.63 12/12/2022   Stable, cont oral replacement  Followup: Return in about 1 year (around 12/12/2023).  Oliver Barre, MD 12/13/2022 8:48 PM [facility]

## 2022-12-12 NOTE — Progress Notes (Signed)
The test results show that your current treatment is OK, as the tests are stable.  Please continue the same plan.  There is no other need for change of treatment or further evaluation based on these results, at this time.  thanks 

## 2022-12-12 NOTE — Patient Instructions (Addendum)
Please have your Shingrix (shingles) shots done at your local pharmacy, and the Tdap tetanus shot  Please continue all other medications as before, and refills have been done if requested.  Please have the pharmacy call with any other refills you may need.  Please continue your efforts at being more active, low cholesterol diet, and weight control.  You are otherwise up to date with prevention measures today.  Please keep your appointments with your specialists as you may have planned - Dr Wyline Mood next week'  Please go to the LAB at the blood drawing area for the tests to be done  You will be contacted by phone if any changes need to be made immediately.  Otherwise, you will receive a letter about your results with an explanation, but please check with MyChart first.  Please remember to sign up for MyChart if you have not done so, as this will be important to you in the future with finding out test results, communicating by private email, and scheduling acute appointments online when needed.  Please make an Appointment to return for your 1 year visit, or sooner if needed

## 2022-12-13 ENCOUNTER — Encounter: Payer: Self-pay | Admitting: Internal Medicine

## 2022-12-13 ENCOUNTER — Other Ambulatory Visit: Payer: Self-pay | Admitting: Internal Medicine

## 2022-12-13 DIAGNOSIS — I10 Essential (primary) hypertension: Secondary | ICD-10-CM

## 2022-12-13 NOTE — Assessment & Plan Note (Signed)
Also for iron lab f/u 

## 2022-12-13 NOTE — Assessment & Plan Note (Signed)
BP Readings from Last 3 Encounters:  12/12/22 138/80  08/09/22 (!) 144/78  06/10/22 (!) 179/66   Stable, pt to continue medical treatment amlod 5 every day, hyzaar 100 25 qd

## 2022-12-13 NOTE — Assessment & Plan Note (Signed)
Lab Results  Component Value Date   HGBA1C 6.0 12/12/2022   Stable, pt to continue current medical treatment  - diet, wt control

## 2022-12-13 NOTE — Assessment & Plan Note (Addendum)
Lab Results  Component Value Date   LDLCALC 92 12/12/2022   Unocntrolled but improved, pt to continue current statin crestor 40 every day, declines add zetia for now, may consider repatha if not better with diet per pt

## 2022-12-13 NOTE — Assessment & Plan Note (Signed)
Last vitamin D Lab Results  Component Value Date   VD25OH 47.63 12/12/2022   Stable, cont oral replacement

## 2022-12-22 ENCOUNTER — Encounter: Payer: Self-pay | Admitting: Internal Medicine

## 2022-12-22 ENCOUNTER — Ambulatory Visit: Payer: Medicare Other | Attending: Internal Medicine | Admitting: Internal Medicine

## 2022-12-22 VITALS — BP 178/70 | HR 69 | Ht 62.0 in | Wt 144.2 lb

## 2022-12-22 DIAGNOSIS — I2510 Atherosclerotic heart disease of native coronary artery without angina pectoris: Secondary | ICD-10-CM | POA: Diagnosis present

## 2022-12-22 NOTE — Patient Instructions (Signed)
Medication Instructions:  Your physician recommends that you continue on your current medications as directed. Please refer to the Current Medication list given to you today.  *If you need a refill on your cardiac medications before your next appointment, please call your pharmacy*  Follow-Up: At [facility], you and your health needs are our priority.  As part of our continuing mission to provide you with exceptional heart care, we have created designated Provider Care Teams.  These Care Teams include your primary Cardiologist (physician) and Advanced Practice Providers (APPs -  Physician Assistants and Nurse Practitioners) who all work together to provide you with the care you need, when you need it.    Your next appointment:   6 month(s)  Provider:   Maisie Fus, MD

## 2022-12-22 NOTE — Progress Notes (Signed)
Cardiology Office Note:    Date:  12/22/2022   ID:  Alexis Price, Alexis Price 09/21/1945, MRN [id number]  PCP:  Corwin Levins, MD   [facility] Providers Cardiologist:  Alexis Fus, MD     Referring MD: Corwin Levins, MD   No chief complaint on file. Elevated CAC   History of Present Illness:    ANGLES TREVIZO is a 77 y.o. female with a hx of HLD, claudication,  referral for CAC  She states that she had brothers (3)  that all died of MI. She told her PCP and coronary CTA was ordered.  Her CAC 889, 93rd percentile. The LAD and circ have significant CAC. She lives in [place]. She walks every morning. She has calf pain on the left with activity that has progressed. She feels tingling in the leg at night. No report of significant rest pain. No discoloration. She has known claudication on the left with abnormal ABIs. She takes aspirin and atorvastatin. LDL 110 mg/dL  in 8756. She denies chest pain or SOB. She denies orthopnea, PND. She notes some swelling in the feet at night. She denies smoking hx.   She has no cardiac hx. She's not had a stress test or echo. She has not followed with cardiology.  Interim Hx 11/10/2021 Hadn't starting the cilostazol.  Still has left leg cramping. She did not take her BP medications. Blood pressures at home; SBP closer to 140s-150s at home.  Interim Hx 05/12/2022 Presents today. Saw Dr. Kirke Corin. Recommended walking program. No intervention needed.  Her leg pain is better.  Interim hx 12/22/2022 BP is well controlled after she takes her medications. She did not take them today. She walks [place] beach every morning, she plays golf. She denies angina or dyspnea on exertion. Still having L leg claudication.    Past Medical History:  Diagnosis Date   Anemia    Aortic atherosclerosis (HCC) 06/01/2021   Arthritis    Cancer (HCC)    Diverticulosis    Essential hypertension 08/24/2007   Qualifier: Diagnosis of  By: Briscoe Burns CMA, Alvy Beal      HYPERLIPIDEMIA    Hyperlipidemia 08/24/2007   Qualifier: Diagnosis of  By: Briscoe Burns CMA, Alvy Beal   medicaation - simvastatin 20 mg  On for > 1 year with no adverse side affects    HYPERTENSION    Postmenopausal atrophic vaginitis 05/12/2011   Temporal arteritis (HCC) 06/20/2017    Past Surgical History:  Procedure Laterality Date   COLON SURGERY     Emergent colectomy with creation of colosomy     flex sigmoidoscopy  1998   [place] of colostomy with salpingectomy and lysis of adhesion  04/2005   TONSILLECTOMY     age 79   TUBAL LIGATION  1976    Current Medications: No outpatient medications have been marked as taking for the 12/22/22 encounter (Appointment) with Alexis Fus, MD.     Allergies:   Patient has no allergy information on record.   Social History   Socioeconomic History   Marital status: Married    Spouse name: Not on file   Number of children: 2   Years of education: 16   Highest education level: Bachelor's degree (e.g., BA, AB, BS)  Occupational History   Occupation: Print production planner  Tobacco Use   Smoking status: Never   Smokeless tobacco: Never  Vaping Use   Vaping status: Never Used  Substance and Sexual Activity   Alcohol use: Yes  Comment: 3-4 times per week   Drug use: No   Sexual activity: Yes    Partners: Male  Other Topics Concern   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 2 sons ' '69, '77. 4 grand-daughters. 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    Denies abuse and feels safe at home.    Social Determinants of Health   Financial Resource Strain: Low Risk  (12/11/2022)   Overall Financial Resource Strain (CARDIA)    Difficulty of Paying Living Expenses: Not very hard  Food Insecurity: No Food Insecurity (12/11/2022)   Hunger Vital Sign    Worried About Running Out of Food in the Last Year: Never true    Ran Out of Food in the Last Year: Never true  Transportation Needs: No Transportation Needs  (12/11/2022)   PRAPARE - Administrator, Civil Service (Medical): No    Lack of Transportation (Non-Medical): No  Physical Activity: Sufficiently Active (12/11/2022)   Exercise Vital Sign    Days of Exercise per Week: 6 days    Minutes of Exercise per Session: 60 min  Stress: No Stress Concern Present (12/11/2022)   [employer] of Occupational Health - Occupational Stress Questionnaire    Feeling of Stress : Only a little  Social Connections: Socially Integrated (12/11/2022)   Social Connection and Isolation Panel [NHANES]    Frequency of Communication with Friends and Family: Three times a week    Frequency of Social Gatherings with Friends and Family: Twice a week    Attends Religious Services: More than 4 times per year    Active Member of [employer] or Organizations: Yes    Attends Engineer, structural: More than 4 times per year    Marital Status: Married     Family History: The patient's  brothers (3)  that all died of MI. family history includes Coronary artery disease in her mother; Heart disease in her mother; Hypertension in her mother. Her father died of MI at 61.  ROS:   Please see the history of present illness.     All other systems reviewed and are negative.  EKGs/Labs/Other Studies Reviewed:    The following studies were reviewed today:  Cardiology Studies CAC 06/01/2021 FINDINGS: Non-cardiac: See separate report from [facility]. Ascending Aorta: Normal caliber. Aortic atherosclerosis. Pericardium: Small pericardial calcification anterior to the right ventricle. Aortic Valve calcium score 166.   Coronary arteries: Normal origins.   Coronary Calcium Score:   Left main: 76   Left anterior descending artery: 521   Left circumflex artery: 228   Right coronary artery: 64   Total: 889   Percentile: 93rd for age, sex, and race matched control.   IMPRESSION: 1. Coronary calcium score of 889. This was 93rd percentile for  age, gender, and race matched controls.   2. Aortic atherosclerosis.   3. Aortic Valve calcium score 166.    ABI/Duplex  Summary:  Right: Resting right ankle-brachial index is within normal range. No  evidence of significant right lower extremity arterial disease.   Left: Resting left ankle-brachial index indicates moderate left lower  extremity arterial disease.    EKG:  EKG is  ordered today.  The ekg ordered today demonstrates   NSR  11/10/2021-sinus bradycardia HR 59 bpm  EKG Interpretation Date/Time:  Thursday December 22 2022 10:35:39 EDT Ventricular Rate:  60 PR Interval:  200 QRS Duration:  86 QT Interval:  442 QTC Calculation:  442 R Axis:   78  Text Interpretation: Normal sinus rhythm Normal ECG No previous ECGs available Confirmed by Carolan Clines ([id number]) on 12/22/2022 10:39:02 AM   Recent Labs: 12/12/2022: ALT 17; BUN 19; Creatinine, Ser 0.80; Hemoglobin 12.2; Platelets 274.0; Potassium 4.6; Sodium 133; TSH 1.85   Recent Lipid Panel    Component Value Date/Time   CHOL 200 12/12/2022 1138   CHOL 207 (H) 05/05/2022 1010   TRIG 95.0 12/12/2022 1138   HDL 89.30 12/12/2022 1138   HDL 88 05/05/2022 1010   CHOLHDL 2 12/12/2022 1138   VLDL 19.0 12/12/2022 1138   LDLCALC 92 12/12/2022 1138   LDLCALC 104 (H) 05/05/2022 1010   LDLCALC 110 (H) 08/23/2019 1636   LDLDIRECT 140.0 05/04/2021 0901     Risk Assessment/Calculations:           Physical Exam:    VS:  Vitals:   12/22/22 1033  BP: (!) 178/70  Pulse: 69  SpO2: 95%     Wt Readings from Last 3 Encounters:  12/12/22 144 lb 3.2 oz (65.4 kg)  08/09/22 150 lb (68 kg)  05/12/22 157 lb 6.4 oz (71.4 kg)     GEN:  Well nourished, well developed in no acute distress HEENT: Normal NECK: No JVD; No carotid bruits LYMPHATICS: No lymphadenopathy CARDIAC: RRR, III/VI SEM RUSB (no significant AS), rubs, gallops Vasc: 2+ radial pulses BL RESPIRATORY:  Clear to auscultation without rales, wheezing or rhonchi   ABDOMEN: Soft, non-tender, non-distended MUSCULOSKELETAL:  No edema; No deformity  SKIN: Warm and well perfused  NEUROLOGIC:  Alert and oriented x 3 PSYCHIATRIC:  Normal affect   ASSESSMENT and PLAN   #Elevated CAC: She's at increased risk of CAD; score 889/93rd percentile. She is asymptomatic. A1c 5.9%. If she develops symptoms of CP/SOB can consider LHC considering she is high risk. - stopped atorvastatin - continue crestor 40 mg daily ; LDL 92 mg/dL recommended starting zetia 10 mg daily and patient did not want to continue 2/2 back pain.  - PCSK9 -TTE EF 60-70,  normal RV function, no significant valve disease.  PAD: + claudication. Left SFA/popliteal artery occlusion. No signs of acute or chronic limb ischemia. She had progressive symptoms and was referred to Dr. Kirke Corin. Recommended continued walking program.  - continue asa - continue on statin - continue exercise - continue cilostazol 50 mg BID   HTN: not well controlled; she did not take her BP medications. Continue losartan-hydrochlorothiazide 100-25 mg daily.   Suspect mild AS - no significant gradient on echo, will continue to monitor; her valve is not well visualized    Starting Leqvio Follow up 6 months         Medication Adjustments/Labs and Tests Ordered: Current medicines are reviewed at length with the patient today.  Concerns regarding medicines are outlined above.  No orders of the defined types were placed in this encounter.  No orders of the defined types were placed in this encounter.   There are no Patient Instructions on file for this visit.   Signed, Alexis Fus, MD  12/22/2022 9:34 AM    [facility]

## 2023-01-04 ENCOUNTER — Other Ambulatory Visit: Payer: Self-pay | Admitting: Pharmacist Clinician (PhC)/ Clinical Pharmacy Specialist

## 2023-01-04 NOTE — Progress Notes (Signed)
Leqvio orders

## 2023-01-05 ENCOUNTER — Telehealth: Payer: Self-pay | Admitting: Pharmacy Technician

## 2023-01-05 NOTE — Telephone Encounter (Signed)
[facility] note:  Patient will be scheduled as soon as possible  Auth Submission: NO AUTH NEEDED Site of care: Site of care: [facility] Payer: medicare a/b & supp Medication & CPT/J Code(s) submitted: Leqvio (Inclisiran) J1306 Route of submission (phone, fax, portal):  Phone # Fax # Auth type: Buy/Bill HB Units/visits requested: x1 Reference number:  Approval from: 01/05/23 to 05/29/20

## 2023-01-11 ENCOUNTER — Ambulatory Visit (INDEPENDENT_AMBULATORY_CARE_PROVIDER_SITE_OTHER): Payer: Medicare Other

## 2023-01-11 VITALS — BP 193/73 | HR 62 | Temp 98.0°F | Resp 16 | Ht 62.0 in | Wt 145.6 lb

## 2023-01-11 DIAGNOSIS — E782 Mixed hyperlipidemia: Secondary | ICD-10-CM | POA: Diagnosis not present

## 2023-01-11 MED ORDER — INCLISIRAN SODIUM 284 MG/1.5ML ~~LOC~~ SOSY
284.0000 mg | PREFILLED_SYRINGE | Freq: Once | SUBCUTANEOUS | Status: AC
  Administered 2023-01-11: 284 mg via SUBCUTANEOUS
  Filled 2023-01-11: qty 1.5

## 2023-01-11 NOTE — Progress Notes (Signed)
Diagnosis: Hyperlipidemia  Provider:  Chilton Greathouse MD  Procedure: Injection  Leqvio (inclisiran), Dose: 284 mg, Site: subcutaneous, Number of injections: 1  Post Care: Observation period completed  Discharge: Condition: Good, Destination: Home . AVS Provided  Performed by:  Nat Math, RN

## 2023-01-11 NOTE — Patient Instructions (Signed)
 Inclisiran Injection What is this medication? INCLISIRAN (in kli SIR an) treats high cholesterol. It works by decreasing bad cholesterol (such as LDL) in your blood. Changes to diet and exercise are often combined with this medication. This medicine may be used for other purposes; ask your health care provider or pharmacist if you have questions. COMMON BRAND NAME(S): LEQVIO What should I tell my care team before I take this medication? They need to know if you have any of these conditions: An unusual or allergic reaction to inclisiran, other medications, foods, dyes, or preservatives Pregnant or trying to get pregnant Breast-feeding How should I use this medication? This medication is injected under the skin. It is given by your care team in a hospital or clinic setting. Talk to your care team about the use of this medication in children. Special care may be needed. Overdosage: If you think you have taken too much of this medicine contact a poison control center or emergency room at once. NOTE: This medicine is only for you. Do not share this medicine with others. What if I miss a dose? Keep appointments for follow-up doses. It is important not to miss your dose. Call your care team if you are unable to keep an appointment. What may interact with this medication? Interactions are not expected. This list may not describe all possible interactions. Give your health care provider a list of all the medicines, herbs, non-prescription drugs, or dietary supplements you use. Also tell them if you smoke, drink alcohol, or use illegal drugs. Some items may interact with your medicine. What should I watch for while using this medication? Visit your care team for regular checks on your progress. Tell your care team if your symptoms do not start to get better or if they get worse. You may need blood work while you are taking this medication. What side effects may I notice from receiving this  medication? Side effects that you should report to your care team as soon as possible: Allergic reactions--skin rash, itching, hives, swelling of the face, lips, tongue, or throat Side effects that usually do not require medical attention (report these to your care team if they continue or are bothersome): Joint pain Pain, redness, or irritation at injection site This list may not describe all possible side effects. Call your doctor for medical advice about side effects. You may report side effects to FDA at 1-800-FDA-1088. Where should I keep my medication? This medication is given in a hospital or clinic. It will not be stored at home. NOTE: This sheet is a summary. It may not cover all possible information. If you have questions about this medicine, talk to your doctor, pharmacist, or health care provider.  2024 Elsevier/Gold Standard (2021-12-10 00:00:00)

## 2023-03-13 ENCOUNTER — Other Ambulatory Visit: Payer: Self-pay | Admitting: Internal Medicine

## 2023-03-13 DIAGNOSIS — I10 Essential (primary) hypertension: Secondary | ICD-10-CM

## 2023-04-13 ENCOUNTER — Ambulatory Visit: Payer: Medicare Other

## 2023-04-14 ENCOUNTER — Ambulatory Visit (INDEPENDENT_AMBULATORY_CARE_PROVIDER_SITE_OTHER): Payer: Medicare Other

## 2023-04-14 VITALS — BP 203/71 | HR 63 | Temp 98.1°F | Resp 20 | Ht 62.0 in | Wt 139.6 lb

## 2023-04-14 DIAGNOSIS — E782 Mixed hyperlipidemia: Secondary | ICD-10-CM | POA: Diagnosis not present

## 2023-04-14 MED ORDER — INCLISIRAN SODIUM 284 MG/1.5ML ~~LOC~~ SOSY
284.0000 mg | PREFILLED_SYRINGE | Freq: Once | SUBCUTANEOUS | Status: AC
  Administered 2023-04-14: 284 mg via SUBCUTANEOUS
  Filled 2023-04-14: qty 1.5

## 2023-04-14 NOTE — Progress Notes (Signed)
Diagnosis: Hyperlipidemia  Provider:  Chilton Greathouse MD  Procedure: Injection  Leqvio (inclisiran), Dose: 284 mg, Site: subcutaneous, Number of injections: 1  Post Care:  right arm injection  Discharge: Condition: Good, Destination: Home . AVS Declined  Performed by:  Rico Ala, LPN

## 2023-05-05 ENCOUNTER — Encounter: Payer: Self-pay | Admitting: Pharmacist Clinician (PhC)/ Clinical Pharmacy Specialist

## 2023-05-05 DIAGNOSIS — E785 Hyperlipidemia, unspecified: Secondary | ICD-10-CM

## 2023-06-01 ENCOUNTER — Other Ambulatory Visit: Payer: Self-pay | Admitting: Internal Medicine

## 2023-06-01 DIAGNOSIS — I10 Essential (primary) hypertension: Secondary | ICD-10-CM

## 2023-06-12 ENCOUNTER — Telehealth: Payer: Self-pay

## 2023-06-12 NOTE — Telephone Encounter (Signed)
 Auth Submission: NO AUTH NEEDED Site of care: Site of care: [facility] Payer: medicare a/b & supp Medication & CPT/J Code(s) submitted: Leqvio  (Inclisiran) J1306 Route of submission (phone, fax, portal):  Phone # Fax # Auth type: Buy/Bill HB Units/visits requested: 284mg  x 2 doses Reference number:  Approval from: 01/05/23 to 06/29/24

## 2023-06-15 LAB — LIPID PANEL
Chol/HDL Ratio: 1.8 {ratio} (ref 0.0–4.4)
Cholesterol, Total: 148 mg/dL (ref 100–199)
HDL: 81 mg/dL (ref >39)
LDL Chol Calc (NIH): 55 mg/dL (ref 0–99)
Triglycerides: 60 mg/dL (ref 0–149)
VLDL Cholesterol Cal: 12 mg/dL (ref 5–40)

## 2023-06-15 LAB — HEPATIC FUNCTION PANEL
ALT: 20 [IU]/L (ref 0–32)
AST: 27 [IU]/L (ref 0–40)
Albumin: 4.6 g/dL (ref 3.8–4.8)
Alkaline Phosphatase: 69 [IU]/L (ref 44–121)
Bilirubin Total: 0.4 mg/dL (ref 0.0–1.2)
Bilirubin, Direct: 0.19 mg/dL (ref 0.00–0.40)
Total Protein: 6.6 g/dL (ref 6.0–8.5)

## 2023-06-20 ENCOUNTER — Ambulatory Visit: Payer: Medicare Other | Admitting: Internal Medicine

## 2023-07-19 ENCOUNTER — Ambulatory Visit: Payer: Medicare Other | Attending: Internal Medicine | Admitting: Internal Medicine

## 2023-07-19 ENCOUNTER — Encounter: Payer: Self-pay | Admitting: Internal Medicine

## 2023-07-19 VITALS — BP 142/80 | HR 61 | Ht 62.0 in | Wt 139.0 lb

## 2023-07-19 DIAGNOSIS — E785 Hyperlipidemia, unspecified: Secondary | ICD-10-CM | POA: Diagnosis present

## 2023-07-19 DIAGNOSIS — I10 Essential (primary) hypertension: Secondary | ICD-10-CM

## 2023-07-19 DIAGNOSIS — I739 Peripheral vascular disease, unspecified: Secondary | ICD-10-CM | POA: Diagnosis present

## 2023-07-19 DIAGNOSIS — I2510 Atherosclerotic heart disease of native coronary artery without angina pectoris: Secondary | ICD-10-CM | POA: Diagnosis not present

## 2023-07-19 MED ORDER — AMLODIPINE BESYLATE 5 MG PO TABS: 5.0000 mg | ORAL_TABLET | Freq: Every day | ORAL | 3 refills | Status: AC

## 2023-07-19 NOTE — Patient Instructions (Signed)
Medication Instructions:  Continue same medications *If you need a refill on your cardiac medications before your next appointment, please call your pharmacy*   Lab Work: None ordered   Testing/Procedures: None ordered   Follow-Up: At [facility], you and your health needs are our priority.  As part of our continuing mission to provide you with exceptional heart care, we have created designated Provider Care Teams.  These Care Teams include your primary Cardiologist (physician) and Advanced Practice Providers (APPs -  Physician Assistants and Nurse Practitioners) who all work together to provide you with the care you need, when you need it.  We recommend signing up for the patient portal called "MyChart".  Sign up information is provided on this After Visit Summary.  MyChart is used to connect with patients for Virtual Visits (Telemedicine).  Patients are able to view lab/test results, encounter notes, upcoming appointments, etc.  Non-urgent messages can be sent to your provider as well.   To learn more about what you can do with MyChart, go to [email or web address].    Your next appointment:  6 months Call in April to schedule August appointment     Provider:  Bernadene Person NP

## 2023-07-19 NOTE — Progress Notes (Signed)
Cardiology Office Note:    Date:  07/19/2023   ID:  JAX ABDELRAHMAN, DOB [birth year], MRN [id number]  PCP:  Corwin Levins, MD   [facility] Providers Cardiologist:  Maisie Fus, MD     Referring MD: Corwin Levins, MD   Chief Complaint  Patient presents with   Follow-up    6 months.  Elevated CAC   History of Present Illness:    Alexis Price is a 78 y.o. female with a hx of HLD, claudication,  referral for CAC  She states that she had brothers (3)  that all died of MI. She told her PCP and coronary CTA was ordered.  Her CAC 889, 93rd percentile. The LAD and circ have significant CAC. She lives in [place]. She walks every morning. She has calf pain on the left with activity that has progressed. She feels tingling in the leg at night. No report of significant rest pain. No discoloration. She has known claudication on the left with abnormal ABIs. She takes aspirin and atorvastatin. LDL 110 mg/dL  in 7829. She denies chest pain or SOB. She denies orthopnea, PND. She notes some swelling in the feet at night. She denies smoking hx.   She has no cardiac hx. She's not had a stress test or echo. She has not followed with cardiology.  Interim Hx 11/10/2021 Hadn't starting the cilostazol.  Still has left leg cramping. She did not take her BP medications. Blood pressures at home; SBP closer to 140s-150s at home.  Interim Hx 05/12/2022 Presents today. Saw Dr. Kirke Corin. Recommended walking program. No intervention needed.  Her leg pain is better.  Interim hx 12/22/2022 BP is well controlled after she takes her medications. She did not take them today. She walks [place] the beach every morning, she plays golf. She denies angina or dyspnea on exertion. Still having L leg claudication.   Interim hx 07/19/2023   Past Medical History:  Diagnosis Date   Anemia    Aortic atherosclerosis (HCC) 06/01/2021   Arthritis    Cancer (HCC)    Diverticulosis    Essential hypertension 08/24/2007    Qualifier: Diagnosis of  By: Briscoe Burns CMA, Alvy Beal     HYPERLIPIDEMIA    Hyperlipidemia 08/24/2007   Qualifier: Diagnosis of  By: Briscoe Burns CMA, Alvy Beal   medicaation - simvastatin 20 mg  On for > 1 year with no adverse side affects    HYPERTENSION    Postmenopausal atrophic vaginitis 05/12/2011   Temporal arteritis (HCC) 06/20/2017    Past Surgical History:  Procedure Laterality Date   COLON SURGERY     Emergent colectomy with creation of colosomy     flex sigmoidoscopy  1998   [place] of colostomy with salpingectomy and lysis of adhesion  04/2005   TONSILLECTOMY     age 74   TUBAL LIGATION  1976    Current Medications: Current Meds  Medication Sig   aspirin 81 MG EC tablet Take 1 tablet (81 mg total) by mouth daily. Swallow whole.   iron polysaccharides (NU-IRON) 150 MG capsule Take 1 capsule (150 mg total) by mouth daily.   losartan-hydrochlorothiazide (HYZAAR) 100-25 MG tablet Take 1 tablet by mouth once daily   rosuvastatin (CRESTOR) 40 MG tablet Take 1 tablet by mouth once daily   [DISCONTINUED] amLODipine (NORVASC) 5 MG tablet Take 1 tablet (5 mg total) by mouth daily.     Allergies:   Patient has no allergy information on record.  Family History: The patient's  brothers (3)  that all died of MI. family history includes Coronary artery disease in her mother; Heart disease in her mother; Hypertension in her mother. Her father died of MI at 77.  ROS:   Please see the history of present illness.     All other systems reviewed and are negative.  EKGs/Labs/Other Studies Reviewed:    The following studies were reviewed today:  Cardiology Studies CAC 06/01/2021 FINDINGS: Non-cardiac: See separate report from [facility]. Ascending Aorta: Normal caliber. Aortic atherosclerosis. Pericardium: Small pericardial calcification anterior to the right ventricle. Aortic Valve calcium score 166.   Coronary arteries: Normal origins.   Coronary Calcium Score:    Left main: 76   Left anterior descending artery: 521   Left circumflex artery: 228   Right coronary artery: 64   Total: 889   Percentile: 93rd for age, sex, and race matched control.   IMPRESSION: 1. Coronary calcium score of 889. This was 93rd percentile for age, gender, and race matched controls.   2. Aortic atherosclerosis.   3. Aortic Valve calcium score 166.    ABI/Duplex  Summary:  Right: Resting right ankle-brachial index is within normal range. No  evidence of significant right lower extremity arterial disease.   Left: Resting left ankle-brachial index indicates moderate left lower  extremity arterial disease.    EKG:  EKG is  ordered today.  The ekg ordered today demonstrates   NSR  11/10/2021-sinus bradycardia HR 59 bpm  EKG Interpretation Date/Time:  Wednesday July 19 2023 10:42:08 EST Ventricular Rate:  61 PR Interval:  208 QRS Duration:  80 QT Interval:  438 QTC Calculation: 440 R Axis:   52  Text Interpretation: Normal sinus rhythm Nonspecific T wave abnormality When compared with ECG of 22-Dec-2022 10:35, No significant change was found Confirmed by Carolan Clines ([id number]) on 07/19/2023 11:23:41 AM    Recent Labs: 12/12/2022: BUN 19; Creatinine, Ser 0.80; Hemoglobin 12.2; Platelets 274.0; Potassium 4.6; Sodium 133; TSH 1.85 06/14/2023: ALT 20   Recent Lipid Panel    Component Value Date/Time   CHOL 148 06/14/2023 1014   TRIG 60 06/14/2023 1014   HDL 81 06/14/2023 1014   CHOLHDL 1.8 06/14/2023 1014   CHOLHDL 2 12/12/2022 1138   VLDL 19.0 12/12/2022 1138   LDLCALC 55 06/14/2023 1014   LDLCALC 110 (H) 08/23/2019 1636   LDLDIRECT 140.0 05/04/2021 0901     Risk Assessment/Calculations:           Physical Exam:    VS:  Vitals:   07/19/23 1037  BP: (!) 142/80  Pulse: 61     Wt Readings from Last 3 Encounters:  07/19/23 139 lb (63 kg)  04/14/23 139 lb 9.6 oz (63.3 kg)  01/11/23 145 lb 9.6 oz (66 kg)     GEN:  Well  nourished, well developed in no acute distress HEENT: Normal NECK: No JVD; No carotid bruits LYMPHATICS: No lymphadenopathy CARDIAC: RRR, III/VI SEM RUSB (no significant AS), rubs, gallops RESPIRATORY:  Clear to auscultation without rales, wheezing or rhonchi  ABDOMEN: Soft, non-tender, non-distended MUSCULOSKELETAL:  No edema; No deformity  SKIN: Warm and well perfused  NEUROLOGIC:  Alert and oriented x 3 PSYCHIATRIC:  Normal affect   ASSESSMENT and PLAN   #Elevated CAC: She's at increased risk of CAD; score 889/93rd percentile. She is asymptomatic. A1c 5.9%/6%. If she develops symptoms of CP/SOB can consider LHC considering she is high risk. - stopped atorvastatin - continue crestor 40 mg daily ; [  LDL was not at goal with statin alone, I added  zetia 10 mg daily and patient did not want to continue 2/2 back pain] -Continue Leqvio, LDL at goal -TTE EF 60-70,  normal RV function, no significant valve disease.  PAD: + claudication. Left SFA/popliteal artery occlusion. No signs of acute or chronic limb ischemia. She had progressive symptoms and was referred to Dr. Kirke Corin. Recommended continued walking program.  - continue asa - continue on statin - continue exercise Was not taking the cilostazol  HTN: not well controlled start norvasc 5 mg daily in the PM Continue losartan-hydrochlorothiazide 100-25 mg daily.   Suspect mild AS - no significant gradient on echo, will continue to monitor; her valve is not well visualized   Overall she is doing well today no symptoms.  We discussed working on her blood pressure  Starting daily Norvasc 5 mg daily Follow up in 6 months with an APP         Medication Adjustments/Labs and Tests Ordered: Current medicines are reviewed at length with the patient today.  Concerns regarding medicines are outlined above.  Orders Placed This Encounter  Procedures   EKG 12-Lead   Meds ordered this encounter  Medications   amLODipine (NORVASC) 5 MG  tablet    Sig: Take 1 tablet (5 mg total) by mouth daily.    Dispense:  90 tablet    Refill:  3    Patient Instructions  Medication Instructions:  Continue same medications *If you need a refill on your cardiac medications before your next appointment, please call your pharmacy*   Lab Work: None ordered   Testing/Procedures: None ordered   Follow-Up: At [facility], you and your health needs are our priority.  As part of our continuing mission to provide you with exceptional heart care, we have created designated Provider Care Teams.  These Care Teams include your primary Cardiologist (physician) and Advanced Practice Providers (APPs -  Physician Assistants and Nurse Practitioners) who all work together to provide you with the care you need, when you need it.  We recommend signing up for the patient portal called "MyChart".  Sign up information is provided on this After Visit Summary.  MyChart is used to connect with patients for Virtual Visits (Telemedicine).  Patients are able to view lab/test results, encounter notes, upcoming appointments, etc.  Non-urgent messages can be sent to your provider as well.   To learn more about what you can do with MyChart, go to [email or web address].    Your next appointment:  6 months Call in April to schedule August appointment     Provider:  Bernadene Person NP         Signed, Maisie Fus, MD  07/19/2023 11:23 AM    [facility]

## 2023-08-15 ENCOUNTER — Ambulatory Visit: Payer: Medicare Other | Admitting: Cardiovascular Disease

## 2023-08-29 ENCOUNTER — Encounter: Payer: Self-pay | Admitting: Cardiovascular Disease

## 2023-08-29 ENCOUNTER — Ambulatory Visit: Attending: Cardiovascular Disease | Admitting: Cardiovascular Disease

## 2023-08-29 VITALS — BP 180/68 | HR 63 | Ht 63.0 in | Wt 139.4 lb

## 2023-08-29 DIAGNOSIS — E785 Hyperlipidemia, unspecified: Secondary | ICD-10-CM | POA: Diagnosis not present

## 2023-08-29 DIAGNOSIS — I1A0 Resistant hypertension: Secondary | ICD-10-CM

## 2023-08-29 DIAGNOSIS — I10 Essential (primary) hypertension: Secondary | ICD-10-CM | POA: Diagnosis present

## 2023-08-29 DIAGNOSIS — R931 Abnormal findings on diagnostic imaging of heart and coronary circulation: Secondary | ICD-10-CM

## 2023-08-29 DIAGNOSIS — I739 Peripheral vascular disease, unspecified: Secondary | ICD-10-CM

## 2023-08-29 NOTE — Progress Notes (Signed)
 Cardiology Office Note   Date:  08/29/2023   ID:  Alexis Price, Alexis Price Dec 16, 1945, MRN [id number]  PCP:  Corwin Levins, MD  Cardiologist: Dr. Carolan Clines  No chief complaint on file.     History of Present Illness: Alexis Price is a 78 y.o. female who is here today for follow-up visit regarding peripheral arterial disease.    She has known history of hyperlipidemia and essential hypertension.  She has family history of coronary artery disease.  She had coronary calcium score which was elevated at 889.  She is a lifelong non-smoker and she is nondiabetic. She is followed for left calf claudication.  This is due to severe stenosis in the left common femoral artery extending into the proximal portion of the SFA.  Her symptoms improved significantly with a walking program.  She continues to walk for exercise with minimal symptoms.  Her blood pressure is elevated but she reports better readings at home.  She sometimes forgets to take amlodipine at night. She denies chest pain or shortness of breath.  Past Medical History:  Diagnosis Date   Anemia    Aortic atherosclerosis (HCC) 06/01/2021   Arthritis    Cancer (HCC)    Diverticulosis    Essential hypertension 08/24/2007   Qualifier: Diagnosis of  By: Briscoe Burns CMA, Alvy Beal     HYPERLIPIDEMIA    Hyperlipidemia 08/24/2007   Qualifier: Diagnosis of  By: Briscoe Burns CMA, Alvy Beal   medicaation - simvastatin 20 mg  On for > 1 year with no adverse side affects    HYPERTENSION    Postmenopausal atrophic vaginitis 05/12/2011   Temporal arteritis (HCC) 06/20/2017    Past Surgical History:  Procedure Laterality Date   COLON SURGERY     Emergent colectomy with creation of colosomy     flex sigmoidoscopy  1998   [place] of colostomy with salpingectomy and lysis of adhesion  04/2005   TONSILLECTOMY     age 49   TUBAL LIGATION  1976     Current Outpatient Medications  Medication Sig Dispense Refill   amLODipine (NORVASC) 5 MG tablet  Take 1 tablet (5 mg total) by mouth daily. 90 tablet 3   aspirin 81 MG EC tablet Take 1 tablet (81 mg total) by mouth daily. Swallow whole. 30 tablet 12   iron polysaccharides (NU-IRON) 150 MG capsule Take 1 capsule (150 mg total) by mouth daily. 90 capsule 1   losartan-hydrochlorothiazide (HYZAAR) 100-25 MG tablet Take 1 tablet by mouth once daily 90 tablet 1   rosuvastatin (CRESTOR) 40 MG tablet Take 1 tablet by mouth once daily 90 tablet 1   No current facility-administered medications for this visit.    Allergies:   Patient has no allergy information on record.    Social History:  The patient  reports that she has never smoked. She has never used smokeless tobacco. She reports current alcohol use. She reports that she does not use drugs.   Family History:  The patient's family history includes Coronary artery disease in her mother; Heart disease in her mother; Hypertension in her mother.    ROS:  Please see the history of present illness.   Otherwise, review of systems are positive for none.   All other systems are reviewed and negative.    PHYSICAL EXAM: VS:  BP (!) 180/68 (BP Location: Left Arm, Patient Position: Sitting, Cuff Size: Normal)   Pulse 63   Ht 5\' 3"  (1.6 m)   Wt 139 lb  6.4 oz (63.2 kg)   SpO2 97%   BMI 24.69 kg/m  , BMI Body mass index is 24.69 kg/m. GEN: Well nourished, well developed, in no acute distress  HEENT: normal  Neck: no JVD, carotid bruits, or masses Cardiac: RRR; no rubs, or gallops,no edema .  2 /6 systolic murmur in the aortic area with radiation to carotid arteries. Respiratory:  clear to auscultation bilaterally, normal work of breathing GI: soft, nontender, nondistended, + BS MS: no deformity or atrophy  Skin: warm and dry, no rash Neuro:  Strength and sensation are intact Psych: euthymic mood, full affect Vascular: Distal pulses are palpable on the right side but not the left.   EKG:  EKG is not ordered today.    Recent  Labs: 12/12/2022: BUN 19; Creatinine, Ser 0.80; Hemoglobin 12.2; Platelets 274.0; Potassium 4.6; Sodium 133; TSH 1.85 06/14/2023: ALT 20    Lipid Panel    Component Value Date/Time   CHOL 148 06/14/2023 1014   TRIG 60 06/14/2023 1014   HDL 81 06/14/2023 1014   CHOLHDL 1.8 06/14/2023 1014   CHOLHDL 2 12/12/2022 1138   VLDL 19.0 12/12/2022 1138   LDLCALC 55 06/14/2023 1014   LDLCALC 110 (H) 08/23/2019 1636   LDLDIRECT 140.0 05/04/2021 0901      Wt Readings from Last 3 Encounters:  08/29/23 139 lb 6.4 oz (63.2 kg)  07/19/23 139 lb (63 kg)  04/14/23 139 lb 9.6 oz (63.3 kg)          No data to display            ASSESSMENT AND PLAN:  1.  Peripheral arterial disease: She has left leg claudication which has improved and currently is mild.  This is due to significant disease into the left common femoral artery extending into the SFA.  Her symptoms are clearly not lifestyle limiting at this point.  I recommend continued medical therapy.    2.  Coronary artery calcifications: Currently with no anginal symptoms.  3.  Resistant hypertension: Blood pressure is not controlled in spite of 3 antihypertensive medications.  She does report that she misses amlodipine frequently and I discussed with her the importance of compliance.  Given uncontrolled hypertension in spite of 3 medications, I requested renal artery duplex to rule out renal artery stenosis especially with her known history of peripheral arterial disease.  I also suggested that she monitors her blood pressure at home.  She does have a blood pressure machine.  4.  Hyperlipidemia: She continues to take rosuvastatin and Leqvio with significant improvement in LDL which was down to 55.   Disposition:   FU with me in 12 months  Signed,  Lorine Bears, MD  08/29/2023 9:34 AM    [facility]

## 2023-08-29 NOTE — Patient Instructions (Signed)
 Medication Instructions:  No changes *If you need a refill on your cardiac medications before your next appointment, please call your pharmacy*  Lab Work: None ordered If you have labs (blood work) drawn today and your tests are completely normal, you will receive your results only by: MyChart Message (if you have MyChart) OR A paper copy in the mail If you have any lab test that is abnormal or we need to change your treatment, we will call you to review the results.  Testing/Procedures: Your physician has requested that you have a renal artery duplex. During this test, an ultrasound is used to evaluate blood flow to the kidneys. Take your medications as you usually do. This will take place at [address], [address].  No food after 11PM the night before.  Water is OK. (Don't drink liquids if you have been instructed not to for ANOTHER test). Avoid foods that produce bowel gas, for 24 hours prior to exam (see below). No breakfast, no chewing gum, no smoking or carbonated beverages. Patient may take morning medications with water. Come in for test at least 15 minutes early to register.  Please note: We ask at that you not bring children with you during ultrasound (echo/ vascular) testing. Due to room size and safety concerns, children are not allowed in the ultrasound rooms during exams. Our front office staff cannot provide observation of children in our lobby area while testing is being conducted. An adult accompanying a patient to their appointment will only be allowed in the ultrasound room at the discretion of the ultrasound technician under special circumstances. We apologize for any inconvenience.   Follow-Up: At [facility], you and your health needs are our priority.  As part of our continuing mission to provide you with exceptional heart care, our providers are all part of one team.  This team includes your primary Cardiologist (physician) and Advanced Practice  Providers or APPs (Physician Assistants and Nurse Practitioners) who all work together to provide you with the care you need, when you need it.  Your next appointment:   12 month(s)  Provider:   Dr. Kirke Corin  We recommend signing up for the patient portal called "MyChart".  Sign up information is provided on this After Visit Summary.  MyChart is used to connect with patients for Virtual Visits (Telemedicine).  Patients are able to view lab/test results, encounter notes, upcoming appointments, etc.  Non-urgent messages can be sent to your provider as well.   To learn more about what you can do with MyChart, go to [email or web address].        1st Floor: - Lobby - Registration  - Pharmacy  - Lab - Cafe  2nd Floor: - PV Lab - Diagnostic Testing (echo, CT, nuclear med)  3rd Floor: - Vacant  4th Floor: - TCTS (cardiothoracic surgery) - AFib Clinic - Structural Heart Clinic - Vascular Surgery  - Vascular Ultrasound  5th Floor: - HeartCare Cardiology (general and EP) - Clinical Pharmacy for coumadin, hypertension, lipid, weight-loss medications, and med management appointments    Valet parking services will be available as well.

## 2023-09-20 ENCOUNTER — Ambulatory Visit (HOSPITAL_COMMUNITY)
Admission: RE | Admit: 2023-09-20 | Discharge: 2023-09-20 | Disposition: A | Discharge status: Home / Self Care | Source: Ambulatory Visit | Attending: Cardiovascular Disease | Admitting: Cardiovascular Disease

## 2023-09-20 DIAGNOSIS — I1A0 Resistant hypertension: Secondary | ICD-10-CM | POA: Insufficient documentation

## 2023-09-20 DIAGNOSIS — I10 Essential (primary) hypertension: Secondary | ICD-10-CM

## 2023-10-13 ENCOUNTER — Ambulatory Visit: Payer: Medicare Other

## 2023-10-13 VITALS — BP 192/70 | HR 61 | Temp 98.1°F | Resp 14 | Ht 63.0 in | Wt 139.2 lb

## 2023-10-13 DIAGNOSIS — E782 Mixed hyperlipidemia: Secondary | ICD-10-CM

## 2023-10-13 MED ORDER — INCLISIRAN SODIUM 284 MG/1.5ML ~~LOC~~ SOSY
284.0000 mg | PREFILLED_SYRINGE | Freq: Once | SUBCUTANEOUS | Status: AC
  Administered 2023-10-13: 284 mg via SUBCUTANEOUS
  Filled 2023-10-13: qty 1.5

## 2023-10-13 NOTE — Progress Notes (Signed)
 Diagnosis: Hyperlipidemia  Provider:  Chilton Greathouse MD  Procedure: Injection  Leqvio (inclisiran), Dose: 284 mg, Site: subcutaneous, Number of injections: 1  Injection Site(s): Left arm  Post Care: Patient declined observation  Discharge: Condition: Good, Destination: Home . AVS Declined  Performed by:  Wyvonne Lenz, RN

## 2023-11-29 ENCOUNTER — Ambulatory Visit

## 2023-12-06 ENCOUNTER — Ambulatory Visit

## 2023-12-08 ENCOUNTER — Ambulatory Visit (INDEPENDENT_AMBULATORY_CARE_PROVIDER_SITE_OTHER)

## 2023-12-08 VITALS — Ht 63.0 in | Wt 135.0 lb

## 2023-12-08 DIAGNOSIS — Z780 Asymptomatic menopausal state: Secondary | ICD-10-CM

## 2023-12-08 DIAGNOSIS — Z0000 Encounter for general adult medical examination without abnormal findings: Secondary | ICD-10-CM | POA: Diagnosis not present

## 2023-12-08 NOTE — Patient Instructions (Addendum)
 Ms. Rouillard , Thank you for taking time out of your busy schedule to complete your Annual Wellness Visit with me. I enjoyed our conversation and look forward to speaking with you again next year. I, as well as your care team,  appreciate your ongoing commitment to your health goals. Please review the following plan we discussed and let me know if I can assist you in the future. Your Game plan/ To Do List    Referrals: If you haven't heard from the office you've been referred to, please reach out to them at the phone provided.  Referral for a DEXA (bone) scan  Follow up Visits: Next Medicare AWV with our clinical staff: 12/09/2024   Have you seen your provider in the last 6 months (3 months if uncontrolled diabetes)? No Next Office Visit with your provider: to be scheduled by the patient  Clinician Recommendations:  Aim for 30 minutes of exercise or brisk walking, 6-8 glasses of water, and 5 servings of fruits and vegetables each day. Educated and advised on getting the Shingles vaccines in 2025.      This is a list of the screening recommended for you and due dates:  Health Maintenance  Topic Date Due   Zoster (Shingles) Vaccine (1 of 2) Never done   DEXA scan (bone density measurement)  Never done   DTaP/Tdap/Td vaccine (1 - Tdap) 06/27/2012   COVID-19 Vaccine (3 - 2024-25 season) 01/29/2023   Flu Shot  12/29/2023   Medicare Annual Wellness Visit  12/07/2024   Pneumococcal Vaccine for age over 70  Completed   Hepatitis C Screening  Completed   Hepatitis B Vaccine  Aged Out   HPV Vaccine  Aged Out   Meningitis B Vaccine  Aged Out   Colon Cancer Screening  Discontinued    Advanced directives: (Provided) Advance directive discussed with you today. I have provided a copy for you to complete at home and have notarized. Once this is complete, please bring a copy in to our office so we can scan it into your chart.  Advance Care Planning is important because it:  [x]  Makes sure you receive  the medical care that is consistent with your values, goals, and preferences  [x]  It provides guidance to your family and loved ones and reduces their decisional burden about whether or not they are making the right decisions based on your wishes.  Follow the link provided in your after visit summary or read over the paperwork we have mailed to you to help you started getting your Advance Directives in place. If you need assistance in completing these, please reach out to us  so that we can help you!

## 2023-12-08 NOTE — Progress Notes (Signed)
 Subjective:   Alexis Price is a 78 y.o. who presents for a Medicare Wellness preventive visit.  As a reminder, Annual Wellness Visits don't include a physical exam, and some assessments may be limited, especially if this visit is performed virtually. We may recommend an in-person follow-up visit with your provider if needed.  Visit Complete: Virtual I connected with  Alexis Price on 12/08/23 by a audio enabled telemedicine application and verified that I am speaking with the correct person using two identifiers.  Patient Location: Home  Provider Location: Office/Clinic  I discussed the limitations of evaluation and management by telemedicine. The patient expressed understanding and agreed to proceed.  Vital Signs: Because this visit was a virtual/telehealth visit, some criteria may be missing or patient reported. Any vitals not documented were not able to be obtained and vitals that have been documented are patient reported.  VideoDeclined- This patient declined Librarian, academic. Therefore the visit was completed with audio only.  Persons Participating in Visit: Patient.  AWV Questionnaire: Yes: Patient Medicare AWV questionnaire was completed by the patient on 11/28/2023; I have confirmed that all information answered by patient is correct and no changes since this date.  Cardiac Risk Factors include: advanced age (>46men, >39 women);dyslipidemia;hypertension     Objective:    Today's Vitals   12/08/23 1252  Weight: 135 lb (61.2 kg)  Height: 5' 3 (1.6 m)   Body mass index is 23.91 kg/m.     12/08/2023    1:05 PM 10/13/2023   10:10 AM 04/14/2023   10:03 AM 11/23/2021    9:39 AM 11/03/2020    3:39 PM 02/27/2020    9:03 AM 02/20/2020    8:52 AM  Advanced Directives  Does Patient Have a Medical Advance Directive? No No No No No No No  Would patient like information on creating a medical advance directive? Yes (MAU/Ambulatory/Procedural  Areas - Information given) No - Patient declined No - Patient declined No - Patient declined No - Patient declined No - Patient declined No - Guardian declined    Current Medications (verified) Outpatient Encounter Medications as of 12/08/2023  Medication Sig   amLODipine  (NORVASC ) 5 MG tablet Take 1 tablet (5 mg total) by mouth daily.   aspirin  81 MG EC tablet Take 1 tablet (81 mg total) by mouth daily. Swallow whole.   iron  polysaccharides (NU-IRON ) 150 MG capsule Take 1 capsule (150 mg total) by mouth daily.   losartan -hydrochlorothiazide  (HYZAAR) 100-25 MG tablet Take 1 tablet by mouth once daily   rosuvastatin  (CRESTOR ) 40 MG tablet Take 1 tablet by mouth once daily   No facility-administered encounter medications on file as of 12/08/2023.    Allergies (verified) Patient has no allergy information on record.   History: Past Medical History:  Diagnosis Date   Anemia    Aortic atherosclerosis (HCC) 06/01/2021   Arthritis    Cancer (HCC)    Diverticulosis    Essential hypertension 08/24/2007   Qualifier: Diagnosis of  By: Sherron CMA, Steen     HYPERLIPIDEMIA    Hyperlipidemia 08/24/2007   Qualifier: Diagnosis of  By: Sherron CMA, Lakisha   medicaation - simvastatin  20 mg  On for > 1 year with no adverse side affects    HYPERTENSION    Postmenopausal atrophic vaginitis 05/12/2011   Temporal arteritis (HCC) 06/20/2017   Past Surgical History:  Procedure Laterality Date   COLON SURGERY     Emergent colectomy with creation of colosomy  flex sigmoidoscopy  1998   [place] of colostomy with salpingectomy and lysis of adhesion  04/2005   TONSILLECTOMY     age 34   TUBAL LIGATION  1976   Family History  Problem Relation Age of Onset   Hypertension Mother    Heart disease Mother    Coronary artery disease Mother        valve repalcements   Social History   Socioeconomic History   Marital status: Married    Spouse name: Not on file   Number of children: 2   Years of  education: 16   Highest education level: Bachelor's degree (e.g., BA, AB, BS)  Occupational History   Occupation: Print production planner  Tobacco Use   Smoking status: Never   Smokeless tobacco: Never  Vaping Use   Vaping status: Never Used  Substance and Sexual Activity   Alcohol use: Yes    Alcohol/week: 1.0 standard drink of alcohol    Types: 1 Glasses of wine per week    Comment: 3-4 times per week   Drug use: No   Sexual activity: Yes    Partners: Male  Other Topics Concern   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 2 sons ' '69, '77. 4 grand-daughters. 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    Denies abuse and feels safe at home.    Social Drivers of Corporate investment banker Strain: Low Risk  (12/08/2023)   Overall Financial Resource Strain (CARDIA)    Difficulty of Paying Living Expenses: Not hard at all  Food Insecurity: No Food Insecurity (12/08/2023)   Hunger Vital Sign    Worried About Running Out of Food in the Last Year: Never true    Ran Out of Food in the Last Year: Never true  Transportation Needs: No Transportation Needs (12/08/2023)   PRAPARE - Administrator, Civil Service (Medical): No    Lack of Transportation (Non-Medical): No  Physical Activity: Sufficiently Active (12/08/2023)   Exercise Vital Sign    Days of Exercise per Week: 6 days    Minutes of Exercise per Session: 60 min  Stress: No Stress Concern Present (12/08/2023)   [employer] of Occupational Health - Occupational Stress Questionnaire    Feeling of Stress: Only a little  Social Connections: Moderately Integrated (12/08/2023)   Social Connection and Isolation Panel    Frequency of Communication with Friends and Family: Three times a week    Frequency of Social Gatherings with Friends and Family: Twice a week    Attends Religious Services: More than 4 times per year    Active Member of [employer] or Organizations: No    Attends Museum/gallery exhibitions officer: Not on file    Marital Status: Married    Tobacco Counseling Counseling given: No    Clinical Intake:  Pre-visit preparation completed: Yes  Pain : No/denies pain     BMI - recorded: 23.91 Nutritional Status: BMI of 19-24  Normal Nutritional Risks: None Diabetes: No  Lab Results  Component Value Date   HGBA1C 6.0 12/12/2022   HGBA1C 5.9 05/04/2021   HGBA1C 6.0 (H) 08/23/2019     How often do you need to have someone help you when you read instructions, pamphlets, or other written materials from your doctor or pharmacy?: 1 - Never  Interpreter Needed?: No  Information entered by :: Verdie Saba, CMA   Activities of Daily Living  12/08/2023   12:55 PM 11/28/2023    3:22 AM  In your present state of health, do you have any difficulty performing the following activities:  Hearing? 0 0  Vision? 0 0  Difficulty concentrating or making decisions? 0 0  Walking or climbing stairs? 0 0  Dressing or bathing? 0 0  Doing errands, shopping? 0 0  Preparing Food and eating ? N N  Using the Toilet? N N  In the past six months, have you accidently leaked urine? N N  Do you have problems with loss of bowel control? N N  Managing your Medications? N N  Managing your Finances? N N  Housekeeping or managing your Housekeeping? N N    Patient Care Team: Norleen Lynwood ORN, MD as PCP - General (Internal Medicine) Alvan Ronal BRAVO, MD (Inactive) as PCP - Cardiology (Cardiology) Robinson Idol, MD (Ophthalmology)  I have updated your Care Teams any recent Medical Services you may have received from other providers in the past year.     Assessment:   This is a routine wellness examination for Shaunta.  Hearing/Vision screen Hearing Screening - Comments:: Denies hearing difficulties   Vision Screening - Comments:: Wears eyeglasses for reading - up to date with routine eye exams with Dr CANDIE Robinson   Goals Addressed               This Visit's Progress      Patient Stated (pt-stated)        Patient stated she plans to continue exercising       Depression Screen     12/08/2023   12:56 PM 10/13/2023   10:10 AM 04/14/2023   10:03 AM 12/12/2022   10:42 AM 11/23/2021    9:42 AM 11/03/2020    3:59 PM 08/23/2019    4:11 PM  PHQ 2/9 Scores  PHQ - 2 Score 0 0 0 0 0 0 0  PHQ- 9 Score 0          Fall Risk     12/08/2023   12:56 PM 11/28/2023    3:22 AM 10/13/2023   10:10 AM 04/14/2023   10:03 AM 12/12/2022   10:42 AM  Fall Risk   Falls in the past year? 0 0 0 0 0  Number falls in past yr: 0 0   0  Injury with Fall? 0 0   0  Risk for fall due to : No Fall Risks  No Fall Risks No Fall Risks No Fall Risks  Follow up Falls evaluation completed;Falls prevention discussed  Falls evaluation completed Falls evaluation completed Falls evaluation completed    MEDICARE RISK AT HOME:  Medicare Risk at Home Any stairs in or around the home?: Yes If so, are there any without handrails?: No Home free of loose throw rugs in walkways, pet beds, electrical cords, etc?: Yes Adequate lighting in your home to reduce risk of falls?: Yes Life alert?: No Use of a cane, walker or w/c?: No Grab bars in the bathroom?: No Shower chair or bench in shower?: No Elevated toilet seat or a handicapped toilet?: No  TIMED UP AND GO:  Was the test performed?  No  Cognitive Function: 6CIT completed        12/08/2023    1:04 PM 11/23/2021    9:49 AM  6CIT Screen  What Year? 0 points 0 points  What month? 0 points 0 points  What time? 0 points 0 points  Count back from 20 0 points  0 points  Months in reverse 0 points 0 points  Repeat phrase 0 points 0 points  Total Score 0 points 0 points    Immunizations Immunization History  Administered Date(s) Administered   Fluad Quad(high Dose 65+) 02/20/2020   Influenza Split 05/11/2011   Influenza, High Dose Seasonal PF 03/06/2013   Influenza, Seasonal, Injecte, Preservative Fre 06/26/2012   PFIZER(Purple  Top)SARS-COV-2 Vaccination 08/22/2019, 09/12/2019   Pneumococcal Conjugate-13 09/20/2016   Pneumococcal Polysaccharide-23 08/22/2018   Tetanus 06/26/2012    Screening Tests Health Maintenance  Topic Date Due   Zoster Vaccines- Shingrix (1 of 2) Never done   DEXA SCAN  Never done   DTaP/Tdap/Td (1 - Tdap) 06/27/2012   COVID-19 Vaccine (3 - 2024-25 season) 01/29/2023   INFLUENZA VACCINE  12/29/2023   Medicare Annual Wellness (AWV)  12/07/2024   Pneumococcal Vaccine: 50+ Years  Completed   Hepatitis C Screening  Completed   Hepatitis B Vaccines  Aged Out   HPV VACCINES  Aged Out   Meningococcal B Vaccine  Aged Out   Colonoscopy  Discontinued    Health Maintenance  Health Maintenance Due  Topic Date Due   Zoster Vaccines- Shingrix (1 of 2) Never done   DEXA SCAN  Never done   DTaP/Tdap/Td (1 - Tdap) 06/27/2012   COVID-19 Vaccine (3 - 2024-25 season) 01/29/2023   Health Maintenance Items Addressed: DEXA ordered  Additional Screening:  Vision Screening: Recommended annual ophthalmology exams for early detection of glaucoma and other disorders of the eye. Would you like a referral to an eye doctor? No    Dental Screening: Recommended annual dental exams for proper oral hygiene  Community Resource Referral / Chronic Care Management: CRR required this visit?  No   CCM required this visit?  No   Plan:    I have personally reviewed and noted the following in the patient's chart:   Medical and social history Use of alcohol, tobacco or illicit drugs  Current medications and supplements including opioid prescriptions. Patient is not currently taking opioid prescriptions. Functional ability and status Nutritional status Physical activity Advanced directives List of other physicians Hospitalizations, surgeries, and ER visits in previous 12 months Vitals Screenings to include cognitive, depression, and falls Referrals and appointments  In addition, I have reviewed and  discussed with patient certain preventive protocols, quality metrics, and best practice recommendations. A written personalized care plan for preventive services as well as general preventive health recommendations were provided to patient.   Verdie CHRISTELLA Saba, CMA   12/08/2023   After Visit Summary: (MyChart) Due to this being a telephonic visit, the after visit summary with patients personalized plan was offered to patient via MyChart   Notes: Nothing significant to report at this time.

## 2023-12-12 ENCOUNTER — Other Ambulatory Visit: Payer: Self-pay

## 2023-12-12 DIAGNOSIS — I10 Essential (primary) hypertension: Secondary | ICD-10-CM

## 2023-12-12 MED ORDER — LOSARTAN POTASSIUM-HCTZ 100-25 MG PO TABS: 1.0000 | ORAL_TABLET | Freq: Every day | ORAL | 2 refills | Status: AC

## 2024-01-08 ENCOUNTER — Other Ambulatory Visit: Payer: Self-pay | Admitting: *Deleted

## 2024-01-08 MED ORDER — ROSUVASTATIN CALCIUM 40 MG PO TABS: 40.0000 mg | ORAL_TABLET | Freq: Every day | ORAL | 3 refills | Status: AC

## 2024-01-25 ENCOUNTER — Ambulatory Visit: Payer: Self-pay | Admitting: Internal Medicine

## 2024-01-25 ENCOUNTER — Encounter: Payer: Self-pay | Admitting: Internal Medicine

## 2024-01-25 ENCOUNTER — Ambulatory Visit (INDEPENDENT_AMBULATORY_CARE_PROVIDER_SITE_OTHER): Admitting: Internal Medicine

## 2024-01-25 DIAGNOSIS — E782 Mixed hyperlipidemia: Secondary | ICD-10-CM | POA: Diagnosis not present

## 2024-01-25 DIAGNOSIS — E559 Vitamin D deficiency, unspecified: Secondary | ICD-10-CM

## 2024-01-25 DIAGNOSIS — I739 Peripheral vascular disease, unspecified: Secondary | ICD-10-CM | POA: Diagnosis not present

## 2024-01-25 DIAGNOSIS — R739 Hyperglycemia, unspecified: Secondary | ICD-10-CM

## 2024-01-25 DIAGNOSIS — I10 Essential (primary) hypertension: Secondary | ICD-10-CM | POA: Diagnosis not present

## 2024-01-25 DIAGNOSIS — Z780 Asymptomatic menopausal state: Secondary | ICD-10-CM | POA: Diagnosis not present

## 2024-01-25 DIAGNOSIS — D509 Iron deficiency anemia, unspecified: Secondary | ICD-10-CM | POA: Diagnosis not present

## 2024-01-25 DIAGNOSIS — E538 Deficiency of other specified B group vitamins: Secondary | ICD-10-CM

## 2024-01-25 LAB — LIPID PANEL
Cholesterol: 177 mg/dL (ref 0–200)
HDL: 95.4 mg/dL (ref >39.00)
LDL Cholesterol: 63 mg/dL (ref 0–99)
NonHDL: 81.24
Total CHOL/HDL Ratio: 2
Triglycerides: 91 mg/dL (ref 0.0–149.0)
VLDL: 18.2 mg/dL (ref 0.0–40.0)

## 2024-01-25 LAB — BASIC METABOLIC PANEL WITH GFR
BUN: 16 mg/dL (ref 6–23)
CO2: 27 meq/L (ref 19–32)
Calcium: 10.2 mg/dL (ref 8.4–10.5)
Chloride: 93 meq/L — ABNORMAL LOW (ref 96–112)
Creatinine, Ser: 0.73 mg/dL (ref 0.40–1.20)
GFR: 78.75 mL/min (ref >60.00)
Glucose, Bld: 100 mg/dL — ABNORMAL HIGH (ref 70–99)
Potassium: 3.7 meq/L (ref 3.5–5.1)
Sodium: 134 meq/L — ABNORMAL LOW (ref 135–145)

## 2024-01-25 LAB — HEPATIC FUNCTION PANEL
ALT: 18 U/L (ref 0–35)
AST: 26 U/L (ref 0–37)
Albumin: 4.9 g/dL (ref 3.5–5.2)
Alkaline Phosphatase: 64 U/L (ref 39–117)
Bilirubin, Direct: 0.1 mg/dL (ref 0.0–0.3)
Total Bilirubin: 0.7 mg/dL (ref 0.2–1.2)
Total Protein: 7.7 g/dL (ref 6.0–8.3)

## 2024-01-25 LAB — URINALYSIS, ROUTINE W REFLEX MICROSCOPIC
Bilirubin Urine: NEGATIVE
Hgb urine dipstick: NEGATIVE
Ketones, ur: NEGATIVE
Nitrite: NEGATIVE
Specific Gravity, Urine: 1.01 (ref 1.000–1.030)
Total Protein, Urine: NEGATIVE
Urine Glucose: NEGATIVE
Urobilinogen, UA: 0.2 (ref 0.0–1.0)
pH: 7.5 (ref 5.0–8.0)

## 2024-01-25 LAB — CBC WITH DIFFERENTIAL/PLATELET
Basophils Absolute: 0 K/uL (ref 0.0–0.1)
Basophils Relative: 0.9 % (ref 0.0–3.0)
Eosinophils Absolute: 0.1 K/uL (ref 0.0–0.7)
Eosinophils Relative: 2.3 % (ref 0.0–5.0)
HCT: 39.2 % (ref 36.0–46.0)
Hemoglobin: 13.2 g/dL (ref 12.0–15.0)
Lymphocytes Relative: 25.9 % (ref 12.0–46.0)
Lymphs Abs: 1.2 K/uL (ref 0.7–4.0)
MCHC: 33.7 g/dL (ref 30.0–36.0)
MCV: 93.2 fl (ref 78.0–100.0)
Monocytes Absolute: 0.4 K/uL (ref 0.1–1.0)
Monocytes Relative: 8.1 % (ref 3.0–12.0)
Neutro Abs: 3 K/uL (ref 1.4–7.7)
Neutrophils Relative %: 62.8 % (ref 43.0–77.0)
Platelets: 310 K/uL (ref 150.0–400.0)
RBC: 4.21 Mil/uL (ref 3.87–5.11)
RDW: 12.4 % (ref 11.5–15.5)
WBC: 4.7 K/uL (ref 4.0–10.5)

## 2024-01-25 LAB — IBC PANEL
Iron: 110 ug/dL (ref 42–145)
Saturation Ratios: 26.6 % (ref 20.0–50.0)
TIBC: 413 ug/dL (ref 250.0–450.0)
Transferrin: 295 mg/dL (ref 212.0–360.0)

## 2024-01-25 LAB — VITAMIN D 25 HYDROXY (VIT D DEFICIENCY, FRACTURES): VITD: 61.49 ng/mL (ref 30.00–100.00)

## 2024-01-25 LAB — VITAMIN B12: Vitamin B-12: 1046 pg/mL — ABNORMAL HIGH (ref 211–911)

## 2024-01-25 LAB — FERRITIN: Ferritin: 288.6 ng/mL (ref 10.0–291.0)

## 2024-01-25 LAB — HEMOGLOBIN A1C: Hgb A1c MFr Bld: 6 % (ref 4.6–6.5)

## 2024-01-25 LAB — TSH: TSH: 1.86 u[IU]/mL (ref 0.35–5.50)

## 2024-01-25 NOTE — Progress Notes (Signed)
 The test results show that your current treatment is OK, as the tests are stable.  Please continue the same plan.  There is no other need for change of treatment or further evaluation based on these results, at this time.  thanks

## 2024-01-25 NOTE — Patient Instructions (Addendum)
 Please have your Shingrix (shingles) shots done at your local pharmacy, and the Tdap tetanus shots  Please continue all other medications as before, and refills have been done if requested.  Please have the pharmacy call with any other refills you may need.  Please continue your efforts at being more active, low cholesterol diet, and weight control.  You are otherwise up to date with prevention measures today.  Please keep your appointments with your specialists as you may have planned - Dr Darron in April 2026  You will be contacted regarding the referral for: Bone Density testing  Please go to the LAB at the blood drawing area for the tests to be done  You will be contacted by phone if any changes need to be made immediately.  Otherwise, you will receive a letter about your results with an explanation, but please check with MyChart first.  Please make an Appointment to return for your 1 year visit, or sooner if needed

## 2024-01-25 NOTE — Assessment & Plan Note (Signed)
 No overt bleeding, today for f/u labs

## 2024-01-25 NOTE — Assessment & Plan Note (Signed)
 Stable, cont current excercise

## 2024-01-25 NOTE — Assessment & Plan Note (Signed)
 BP Readings from Last 3 Encounters:  10/13/23 (!) 192/70  08/29/23 (!) 180/68  07/19/23 (!) 142/80   Uncontrolled here, but pt states controlled at home, decline change in tx,  pt to continue medical treatment norvasc  5 every day, losartan  Hct 100 - 25 qd

## 2024-01-25 NOTE — Assessment & Plan Note (Signed)
Last vitamin D Lab Results  Component Value Date   VD25OH 47.63 12/12/2022   Stable, cont oral replacement

## 2024-01-25 NOTE — Assessment & Plan Note (Signed)
 Lab Results  Component Value Date   LDLCALC 55 06/14/2023   Stable, pt to continue current statin crestor  40 mg and leqvio 

## 2024-01-25 NOTE — Progress Notes (Signed)
 Patient ID: Alexis Price, female   DOB: 02/15/1946, 78 y.o.   MRN: [id number]         Chief Complaint:: yearly exam       HPI:  Alexis Price is a 78 y.o. female here overall doing well.  Pt denies chest pain, increased sob or doe, wheezing, orthopnea, PND, increased LE swelling, palpitations, dizziness or syncope.   Walking 3 -4 mles per day to improve her left leg claudication symptoms.  Due for DXA and labs.   Pt denies polydipsia, polyuria, or new focal neuro s/s.    Pt denies fever, wt loss, night sweats, loss of appetite, or other constitutional symptoms   Last Echo 2023 with normal EF and no significant valve problem.  Pt states BP at home is controlled, and declines further change today.          Wt Readings from Last 3 Encounters:  12/08/23 135 lb (61.2 kg)  10/13/23 139 lb 3.2 oz (63.1 kg)  08/29/23 139 lb 6.4 oz (63.2 kg)   BP Readings from Last 3 Encounters:  10/13/23 (!) 192/70  08/29/23 (!) 180/68  07/19/23 (!) 142/80   Immunization History  Administered Date(s) Administered   Fluad Quad(high Dose 65+) 02/20/2020   INFLUENZA, HIGH DOSE SEASONAL PF 03/06/2013   Influenza Split 05/11/2011   Influenza, Seasonal, Injecte, Preservative Fre 06/26/2012   PFIZER(Purple Top)SARS-COV-2 Vaccination 08/22/2019, 09/12/2019   Pneumococcal Conjugate-13 09/20/2016   Pneumococcal Polysaccharide-23 08/22/2018   Tetanus 06/26/2012   Health Maintenance Due  Topic Date Due   Zoster Vaccines- Shingrix (1 of 2) Never done   DEXA SCAN  Never done   DTaP/Tdap/Td (1 - Tdap) 06/27/2012   INFLUENZA VACCINE  12/29/2023      Past Medical History:  Diagnosis Date   Anemia    Aortic atherosclerosis (HCC) 06/01/2021   Arthritis    Cancer (HCC)    Diverticulosis    Essential hypertension 08/24/2007   Qualifier: Diagnosis of  By: Sherron CMA, Steen     HYPERLIPIDEMIA    Hyperlipidemia 08/24/2007   Qualifier: Diagnosis of  By: Sherron CMA, Lakisha   medicaation - simvastatin  20 mg  On  for > 1 year with no adverse side affects    HYPERTENSION    Postmenopausal atrophic vaginitis 05/12/2011   Temporal arteritis (HCC) 06/20/2017   Past Surgical History:  Procedure Laterality Date   COLON SURGERY     Emergent colectomy with creation of colosomy     flex sigmoidoscopy  1998   [place] of colostomy with salpingectomy and lysis of adhesion  04/2005   TONSILLECTOMY     age 2   TUBAL LIGATION  1976    reports that she has never smoked. She has never used smokeless tobacco. She reports current alcohol use of about 1.0 standard drink of alcohol per week. She reports that she does not use drugs. family history includes Coronary artery disease in her mother; Heart disease in her mother; Hypertension in her mother. No Known Allergies Current Outpatient Medications on File Prior to Visit  Medication Sig Dispense Refill   amLODipine  (NORVASC ) 5 MG tablet Take 1 tablet (5 mg total) by mouth daily. 90 tablet 3   aspirin  81 MG EC tablet Take 1 tablet (81 mg total) by mouth daily. Swallow whole. 30 tablet 12   iron  polysaccharides (NU-IRON ) 150 MG capsule Take 1 capsule (150 mg total) by mouth daily. 90 capsule 1   losartan -hydrochlorothiazide  (HYZAAR) 100-25 MG tablet Take 1 tablet  by mouth daily. 90 tablet 2   rosuvastatin  (CRESTOR ) 40 MG tablet Take 1 tablet (40 mg total) by mouth daily. 90 tablet 3   No current facility-administered medications on file prior to visit.        ROS:  All others reviewed and negative.  Objective        PE:  There were no vitals taken for this visit.                Constitutional: Pt appears in NAD               HENT: Head: NCAT.                Right Ear: External ear normal.                 Left Ear: External ear normal.                Eyes: . Pupils are equal, round, and reactive to light. Conjunctivae and EOM are normal               Nose: without d/c or deformity               Neck: Neck supple. Gross normal ROM                Cardiovascular: Normal rate and regular rhythm.  Gr 2/6 sys murmur               Pulmonary/Chest: Effort normal and breath sounds without rales or wheezing.                Abd:  Soft, NT, ND, + BS, no organomegaly               Neurological: Pt is alert. At baseline orientation, motor grossly intact               Skin: Skin is warm. No rashes, no other new lesions, LE edema - none               Psychiatric: Pt behavior is normal without agitation   Micro: none  Cardiac tracings I have personally interpreted today:  none  Pertinent Radiological findings (summarize): none   Lab Results  Component Value Date   WBC 5.3 12/12/2022   HGB 12.2 12/12/2022   HCT 36.0 12/12/2022   PLT 274.0 12/12/2022   GLUCOSE 102 (H) 12/12/2022   CHOL 148 06/14/2023   TRIG 60 06/14/2023   HDL 81 06/14/2023   LDLDIRECT 140.0 05/04/2021   LDLCALC 55 06/14/2023   ALT 20 06/14/2023   AST 27 06/14/2023   NA 133 (L) 12/12/2022   K 4.6 12/12/2022   CL 95 (L) 12/12/2022   CREATININE 0.80 12/12/2022   BUN 19 12/12/2022   CO2 28 12/12/2022   TSH 1.85 12/12/2022   HGBA1C 6.0 12/12/2022   Assessment/Plan:  Alexis Price is a 78 y.o. White or Caucasian [1] female with  has a past medical history of Anemia, Aortic atherosclerosis (HCC) (06/01/2021), Arthritis, Cancer (HCC), Diverticulosis, Essential hypertension (08/24/2007), HYPERLIPIDEMIA, Hyperlipidemia (08/24/2007), HYPERTENSION, Postmenopausal atrophic vaginitis (05/12/2011), and Temporal arteritis (HCC) (06/20/2017).  Essential hypertension BP Readings from Last 3 Encounters:  10/13/23 (!) 192/70  08/29/23 (!) 180/68  07/19/23 (!) 142/80   Uncontrolled here, but pt states controlled at home, decline change in tx,  pt to continue medical treatment norvasc  5 every day, losartan  Hct 100 - 25 qd    Hyperglycemia Lab Results  Component Value Date   HGBA1C 6.0 12/12/2022   Stable, pt to continue current medical treatment  - diet, wt  control   Hyperlipidemia Lab Results  Component Value Date   LDLCALC 55 06/14/2023   Stable, pt to continue current statin crestor  40 mg and leqvio    Iron  deficiency anemia due to chronic blood loss No overt bleeding, today for f/u labs  Vitamin D  deficiency Last vitamin D  Lab Results  Component Value Date   VD25OH 47.63 12/12/2022   Stable, cont oral replacement   Left leg claudication (HCC) Stable, cont current excercise  Followup: Return in about 1 year (around 01/24/2025).  Lynwood Rush, MD 01/25/2024 11:03 AM [facility]  Primary Care - [facility] Internal Medicine

## 2024-01-25 NOTE — Assessment & Plan Note (Signed)
Lab Results  Component Value Date   HGBA1C 6.0 12/12/2022   Stable, pt to continue current medical treatment  - diet, wt control

## 2024-04-15 ENCOUNTER — Ambulatory Visit

## 2024-04-17 ENCOUNTER — Ambulatory Visit

## 2024-04-17 VITALS — BP 188/71 | HR 65 | Temp 97.8°F | Resp 16 | Ht 63.0 in | Wt 140.8 lb

## 2024-04-17 DIAGNOSIS — E782 Mixed hyperlipidemia: Secondary | ICD-10-CM | POA: Diagnosis not present

## 2024-04-17 MED ORDER — INCLISIRAN SODIUM 284 MG/1.5ML ~~LOC~~ SOSY
284.0000 mg | PREFILLED_SYRINGE | Freq: Once | SUBCUTANEOUS | Status: AC
  Administered 2024-04-17: 284 mg via SUBCUTANEOUS
  Filled 2024-04-17: qty 1.5

## 2024-04-17 NOTE — Progress Notes (Signed)
 Diagnosis: Hyperlipidemia  Provider:  Chilton Greathouse MD  Procedure: Injection  Leqvio (inclisiran), Dose: 284 mg, Site: subcutaneous, Number of injections: 1  Injection Site(s): Left arm  Post Care:  left arm injection  Discharge: Condition: Good, Destination: Home . AVS Declined  Performed by:  Rico Ala, LPN

## 2024-06-10 ENCOUNTER — Telehealth: Payer: Self-pay

## 2024-06-10 NOTE — Telephone Encounter (Signed)
 Auth Submission: NO AUTH NEEDED Site of care: Site of care: [facility] Payer: Medicare A/B with BCBS supplement Medication & CPT/J Code(s) submitted: Leqvio  (Inclisiran) J1306 Diagnosis Code:  Route of submission (phone, fax, portal):  Phone # Fax # Auth type: Buy/Bill PB Units/visits requested: 284mg  x 2 doses Reference number:  Approval from: 06/10/24 to 06/29/25

## 2024-10-15 ENCOUNTER — Ambulatory Visit

## 2024-12-09 ENCOUNTER — Ambulatory Visit

## 2025-01-27 ENCOUNTER — Encounter: Admitting: Internal Medicine
